# Patient Record
Sex: Male | Born: 1935
Health system: Southern US, Community
[De-identification: ages and names within clinical notes are randomized; demographics above are authoritative.]

## PROBLEM LIST (undated history)

## (undated) DIAGNOSIS — E785 Hyperlipidemia, unspecified: Secondary | ICD-10-CM

## (undated) DIAGNOSIS — C61 Malignant neoplasm of prostate: Secondary | ICD-10-CM

## (undated) DIAGNOSIS — M1990 Unspecified osteoarthritis, unspecified site: Secondary | ICD-10-CM

## (undated) DIAGNOSIS — C6990 Malignant neoplasm of unspecified site of unspecified eye: Secondary | ICD-10-CM

## (undated) DIAGNOSIS — H8110 Benign paroxysmal vertigo, unspecified ear: Secondary | ICD-10-CM

## (undated) DIAGNOSIS — N189 Chronic kidney disease, unspecified: Secondary | ICD-10-CM

## (undated) HISTORY — PX: EYE SURGERY: SHX253

## (undated) HISTORY — DX: Unspecified osteoarthritis, unspecified site: M19.90

## (undated) HISTORY — DX: Hyperlipidemia, unspecified: E78.5

## (undated) HISTORY — DX: Chronic kidney disease, unspecified: N18.9

## (undated) HISTORY — PX: SKIN GRAFT: SHX250

## (undated) HISTORY — DX: Benign paroxysmal vertigo, unspecified ear: H81.10

## (undated) HISTORY — PX: JOINT REPLACEMENT: SHX530

---

## 2005-02-23 ENCOUNTER — Ambulatory Visit: Payer: Self-pay | Admitting: Chiropractic Medicine

## 2005-08-10 ENCOUNTER — Ambulatory Visit: Payer: Self-pay | Admitting: Ophthalmology

## 2005-12-29 ENCOUNTER — Ambulatory Visit: Payer: Self-pay | Admitting: Gastroenterology

## 2006-02-06 ENCOUNTER — Ambulatory Visit: Payer: Self-pay | Admitting: Cardiovascular Disease

## 2006-02-09 ENCOUNTER — Ambulatory Visit: Payer: Self-pay | Admitting: Cardiovascular Disease

## 2008-09-14 ENCOUNTER — Ambulatory Visit: Payer: Self-pay | Admitting: Family Medicine

## 2008-09-21 ENCOUNTER — Ambulatory Visit: Payer: Self-pay | Admitting: Family Medicine

## 2010-01-17 ENCOUNTER — Emergency Department: Payer: Self-pay | Admitting: Emergency Medicine

## 2010-04-27 ENCOUNTER — Observation Stay: Payer: Self-pay | Admitting: Internal Medicine

## 2010-05-05 ENCOUNTER — Ambulatory Visit: Payer: Self-pay | Admitting: Cardiovascular Disease

## 2011-04-07 ENCOUNTER — Emergency Department: Payer: Self-pay | Admitting: Emergency Medicine

## 2012-04-10 ENCOUNTER — Ambulatory Visit: Payer: Self-pay | Admitting: Family Medicine

## 2012-09-17 ENCOUNTER — Ambulatory Visit: Payer: Self-pay | Admitting: General Practice

## 2012-09-17 LAB — URINALYSIS, COMPLETE
Blood: NEGATIVE
Glucose,UR: NEGATIVE mg/dL (ref 0–75)
Protein: 30
RBC,UR: 3 /HPF (ref 0–5)
Specific Gravity: 1.026 (ref 1.003–1.030)
Squamous Epithelial: <1

## 2012-09-17 LAB — BASIC METABOLIC PANEL
Calcium, Total: 8.8 mg/dL (ref 8.5–10.1)
Creatinine: 1.28 mg/dL (ref 0.60–1.30)
EGFR (African American): >60
EGFR (Non-African Amer.): 55 — ABNORMAL LOW
Glucose: 81 mg/dL (ref 65–99)
Osmolality: 289 (ref 275–301)

## 2012-09-17 LAB — MRSA PCR SCREENING

## 2012-09-17 LAB — CBC
HCT: 41.2 % (ref 40.0–52.0)
HGB: 14.6 g/dL (ref 13.0–18.0)
MCV: 87 fL (ref 80–100)
RDW: 13.9 % (ref 11.5–14.5)
WBC: 8.9 10*3/uL (ref 3.8–10.6)

## 2012-09-17 LAB — APTT: Activated PTT: 28.6 secs (ref 23.6–35.9)

## 2012-09-17 LAB — PROTIME-INR: INR: 0.9

## 2012-10-03 ENCOUNTER — Inpatient Hospital Stay: Payer: Self-pay | Admitting: General Practice

## 2012-10-04 LAB — BASIC METABOLIC PANEL
Anion Gap: 11 (ref 7–16)
Calcium, Total: 8 mg/dL — ABNORMAL LOW (ref 8.5–10.1)
Co2: 23 mmol/L (ref 21–32)
EGFR (African American): 54 — ABNORMAL LOW
Glucose: 96 mg/dL (ref 65–99)
Osmolality: 284 (ref 275–301)
Sodium: 142 mmol/L (ref 136–145)

## 2012-10-04 LAB — PLATELET COUNT: Platelet: 119 10*3/uL — ABNORMAL LOW (ref 150–440)

## 2012-10-04 LAB — HEMOGLOBIN: HGB: 11.5 g/dL — ABNORMAL LOW (ref 13.0–18.0)

## 2012-10-05 LAB — BASIC METABOLIC PANEL
Anion Gap: 9 (ref 7–16)
Anion Gap: 7 (ref 7–16)
BUN: 20 mg/dL — ABNORMAL HIGH (ref 7–18)
Calcium, Total: 8 mg/dL — ABNORMAL LOW (ref 8.5–10.1)
Calcium, Total: 8.8 mg/dL (ref 8.5–10.1)
Chloride: 106 mmol/L (ref 98–107)
Co2: 25 mmol/L (ref 21–32)
Creatinine: 1.19 mg/dL (ref 0.60–1.30)
Creatinine: 1.5 mg/dL — ABNORMAL HIGH (ref 0.60–1.30)
EGFR (African American): >60
EGFR (African American): 52 — ABNORMAL LOW
EGFR (Non-African Amer.): 60 — ABNORMAL LOW
Glucose: 70 mg/dL (ref 65–99)
Potassium: 5.3 mmol/L — ABNORMAL HIGH (ref 3.5–5.1)
Potassium: 4.4 mmol/L (ref 3.5–5.1)
Sodium: 139 mmol/L (ref 136–145)
Sodium: 138 mmol/L (ref 136–145)

## 2012-10-05 LAB — PLATELET COUNT: Platelet: 78 10*3/uL — ABNORMAL LOW (ref 150–440)

## 2012-10-05 LAB — HEMOGLOBIN: HGB: 11.3 g/dL — ABNORMAL LOW (ref 13.0–18.0)

## 2012-10-06 LAB — BASIC METABOLIC PANEL
Anion Gap: 7 (ref 7–16)
Calcium, Total: 8.1 mg/dL — ABNORMAL LOW (ref 8.5–10.1)
Co2: 25 mmol/L (ref 21–32)
EGFR (African American): >60
EGFR (Non-African Amer.): 54 — ABNORMAL LOW
Glucose: 94 mg/dL (ref 65–99)
Osmolality: 281 (ref 275–301)
Sodium: 140 mmol/L (ref 136–145)

## 2012-10-18 ENCOUNTER — Emergency Department: Payer: Self-pay | Admitting: Emergency Medicine

## 2012-10-18 LAB — COMPREHENSIVE METABOLIC PANEL
Albumin: 2.8 g/dL — ABNORMAL LOW (ref 3.4–5.0)
Alkaline Phosphatase: 107 U/L (ref 50–136)
Anion Gap: 7 (ref 7–16)
BUN: 22 mg/dL — ABNORMAL HIGH (ref 7–18)
Calcium, Total: 9.2 mg/dL (ref 8.5–10.1)
Glucose: 112 mg/dL — ABNORMAL HIGH (ref 65–99)
Osmolality: 283 (ref 275–301)
Potassium: 3.7 mmol/L (ref 3.5–5.1)
SGOT(AST): 22 U/L (ref 15–37)
SGPT (ALT): 33 U/L (ref 12–78)
Total Protein: 6.9 g/dL (ref 6.4–8.2)

## 2012-10-18 LAB — CBC
HCT: 33.9 % — ABNORMAL LOW (ref 40.0–52.0)
HGB: 11.4 g/dL — ABNORMAL LOW (ref 13.0–18.0)
MCV: 88 fL (ref 80–100)
Platelet: 377 10*3/uL (ref 150–440)
RBC: 3.84 10*6/uL — ABNORMAL LOW (ref 4.40–5.90)
RDW: 13.2 % (ref 11.5–14.5)
WBC: 11.3 10*3/uL — ABNORMAL HIGH (ref 3.8–10.6)

## 2013-04-30 ENCOUNTER — Ambulatory Visit: Payer: Self-pay | Admitting: Urology

## 2013-04-30 LAB — BASIC METABOLIC PANEL
Anion Gap: 4 — ABNORMAL LOW (ref 7–16)
BUN: 22 mg/dL — ABNORMAL HIGH (ref 7–18)
Co2: 28 mmol/L (ref 21–32)
Glucose: 79 mg/dL (ref 65–99)

## 2013-05-02 ENCOUNTER — Ambulatory Visit: Payer: Self-pay | Admitting: Urology

## 2013-05-09 ENCOUNTER — Ambulatory Visit: Payer: Self-pay | Admitting: Urology

## 2014-06-02 ENCOUNTER — Ambulatory Visit: Payer: Self-pay | Admitting: Urology

## 2015-03-24 NOTE — Discharge Summary (Signed)
PATIENT NAME:  BASILIO, MEADOW MR#:  [id number] DATE OF BIRTH:  [birth year]  DATE OF ADMISSION:  10/03/2012 DATE OF DISCHARGE:  10/06/2012  ADMITTING DIAGNOSIS: Degenerative arthrosis of the left knee.   DISCHARGE DIAGNOSIS: Degenerative arthrosis of the left knee.   HISTORY: The patient is a pleasant 80 year old male who has been followed at [facility] for progression of left knee pain. He had reported a several year history of intermittent left knee pain that had progressively gotten worse. The pain was occasionally more severe to the point that it required him to use crutches as well as narcotic analgesics. The pain was noted to be aggravated with weight-bearing activities. The patient had not seen any significant improvement in his condition despite use of anti-inflammatory medications as well as activity modifications. He had localized most of the pain along the medial aspect of the knee. His knee pain had progressed to the point that it was significantly interfering with his activities of daily living. X-rays taken in the orthopedic department of [facility] showed narrowing of the medial cartilage space with associated varus alignment. He was noted to have osteophyte as well as subchondral sclerosis. After a discussion of the risks and benefits of surgical intervention, the patient expressed his understanding of the risks and benefits and agreed for plans for surgical intervention.   PROCEDURE: Left total knee arthroplasty using computer-assisted navigation.   ANESTHESIA: Femoral nerve block with spinal.   SOFT TISSUE RELEASE: Anterior cruciate ligament, posterior cruciate ligament, deep medial collateral ligaments, as well as patellofemoral ligament.   IMPLANTS UTILIZED: DePuy PFC Sigma size four posterior stabilized femoral component (cemented), size four MBT tibial component (cemented), 38 mm three pegged oval dome patella (cemented), and a 12.5 mm stabilized rotating platform  polyethylene insert.   HOSPITAL COURSE: The patient tolerated the procedure very well. He had no complications. He was then taken to the PAC-U where he was stabilized and then transferred to the orthopedic floor. The patient began receiving anticoagulation therapy of Lovenox 30 mg subcutaneous every 12 hours per anesthesia and pharmacy protocol. He was fitted with TED stockings bilaterally. These were allowed to be removed one hour per eight hour shift. The left one was applied on day two following removal of the Hemovac and the dressing change. He was also fitted with the AV-I compression foot pumps bilaterally set at 80 mmHg. His calves have been nontender. There has been no evidence of any deep venous thromboses. Heels were elevated off the bed using rolled towels. No evidence of any tissue breakdown noted.   The patient has denied any chest pain or shortness of breath. Vital signs have been stable. He has been afebrile. Hemodynamically, he was stable and required no transfusion other than the Autovac transfusion he was given the first six hours postoperatively. Laboratory studies were for the most part unremarkable. However, on day two he was noted to have a significant increase in his potassium from 4.2 to 5.3 and subsequently a repeat potassium showed that he was normal at 4.4. This was felt to be hemolysis. He has had no symptoms. The remaining laboratory studies were felt to be within tolerable limits.   Physical therapy was initiated on day one for gait training and transfers. Upon being discharged, she was ambulating greater than 200 feet. She was independent with bed to chair transfers. She was able go up and down four sets of steps. Occupational therapy was also initiated on day one for activities of daily living and assistive  devices.   The patient's IV, Foley and Hemovac were discontinued on day two along with a dressing change. The wound was free of any drainage or signs of infection. The  Polar Care was reapplied to the surgical leg maintaining a temperature of 40 to 50 degrees Fahrenheit.   DISPOSITION: The patient is being discharged to home in improved stable condition.   DISCHARGE INSTRUCTIONS:  1. He may weight bear as tolerated.  2. Continue using TED stockings. These are to be worn during the day, but may be removed at night.  3. He is to continue to use a walker until cleared by physical therapy to go to a quad cane.  4. He will receive home health physical therapy for two weeks.  5. Continue using the Polar Care maintaining a temperature of 40 to 50 degrees Fahrenheit. Recommend that he wear this pretty much around-the-clock for the first two weeks.  6. He is placed on a regular diet.  7. He is to resume his regular medications that he was on prior to admission.  8. Prescription for Lovenox 40 mg subcutaneously daily for 14 days was given.  9. He is then to begin taking one 81 mg enteric-coated aspirin per day.  10. A prescription for oxycodone 5 to 10 mg every 4 to 6 hours p.r.n. for pain was given and Ultram 50 to 100 mg q.4-6h. p.r.n. for pain.   PAST MEDICAL HISTORY:  1. Hypertension.  2. Hyperlipidemia.  3. Kidney stones. 4. Prostate cancer.  5. Skin cancer. 6. 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 7. Cataracts.    ____________________________ Vance Peper, PA jrw:ap D: 10/06/2012 08:41:39 ET T: 10/06/2012 14:30:52 ET JOB#: [id number]  cc: Vance Peper, PA, <Dictator> [place] PA ELECTRONICALLY SIGNED 10/09/2012 7:38

## 2015-03-24 NOTE — Op Note (Signed)
PATIENT NAME:  Gerald Barry, Gerald Barry MR#:  [id number] DATE OF BIRTH:  [birth year]  DATE OF PROCEDURE:  10/03/2012  PREOPERATIVE DIAGNOSIS: Degenerative arthrosis of the left knee.   POSTOPERATIVE DIAGNOSIS: Degenerative arthrosis of the left knee.  PROCEDURE PERFORMED: Left total knee arthroplasty using computer-assisted navigation.   SURGEON: Laurice Record. Holley Bouche., MD  ASSISTANT: Vance Peper, PA-C (required to maintain retraction throughout the procedure)   ANESTHESIA: Femoral nerve block and spinal.   ESTIMATED BLOOD LOSS: 100 mL.   FLUIDS REPLACED: 1800 mL of crystalloid.   TOURNIQUET TIME: 106 minutes.   DRAINS: Two medium drains to reinfusion system.   SOFT TISSUE RELEASES: Anterior cruciate ligament, posterior cruciate ligament, deep medial collateral ligament, and patellofemoral ligament.   IMPLANTS UTILIZED: DePuy PFC Sigma size 4 posterior stabilized femoral component (cemented), size 4 MBT tibial component (cemented), 38 mm three peg oval dome patella (cemented), and a 12.5 mm stabilized rotating platform polyethylene insert.   INDICATIONS FOR SURGERY: The patient is a 80 year old male who has been seen for complaints of progressive left knee pain. X-rays demonstrated severe degenerative changes in a tricompartmental fashion. After discussion of the risks and benefits of surgical intervention, the patient expressed his understanding of the risks and benefits and agreed with plans for surgical intervention.   PROCEDURE IN DETAIL: Patient was brought into the Operating Room and, after adequate femoral nerve block and spinal anesthesia was achieved, a tourniquet was placed on patient's upper left thigh. Patient's left knee and leg were cleaned and prepped with alcohol and DuraPrep, draped in the usual sterile fashion. A "timeout" was performed as per usual protocol. The left lower extremity was exsanguinated using Esmarch, and the tourniquet was inflated to 300 mmHg. Anterior longitudinal  incision made followed by a standard mid vastus approach. A large effusion was evacuated. The deep fibers of the medial collateral ligament were elevated in subperiosteal fashion off the medial flare of the tibia so as to maintain a continuous soft tissue sleeve. Patella was subluxed laterally and the patellofemoral ligament was incised. Inspection of the knee demonstrated severe degenerative changes most notably to the medial compartment. Prominent osteophytes were debrided using rongeur. Anterior and posterior cruciate ligaments were excised. Two 4.0 mm Schanz pins were inserted into the femur and into the tibia for attachment of the array of trackers used for computer-assisted navigation. Hip center was identified using circumduction technique. Distal landmarks were mapped using computer. Distal femur and proximal tibia were mapped using computer. Distal femoral cutting guide was positioned using computer-assisted navigation so as to achieve a 5 degrees distal valgus cut. Cut was performed and verified using computer. Distal femur was sized and it was felt that a size 4 femoral component was appropriate. A size 4 cutting guide was positioned and the anterior cut was performed, verified using computer. This was followed by completion of the posterior and chamfer cuts. Femoral cutting guide for central box was positioned and central box cut was performed.   Attention was then directed to the proximal tibia. Medial and lateral menisci were excised. The extramedullary tibial cutting guide was positioned using computer-assisted navigation so as to achieve 0 degree varus valgus alignment and 0 degree posterior slope. Cut was performed and verified using the computer. Proximal tibia was sized was felt that a size 4 tibial tray was appropriate. Tibial and femoral trials were inserted followed by insertion of first a 10 and subsequently a 12.5 mm polyethylene trial. Excellent mediolateral soft tissue balancing was  appreciated both in  full extension and in flexion. Finally, the patella was cut and prepared so as to accommodate a 38 mm three peg oval dome patella. Patella trial was placed and the knee was placed through a range of motion with excellent patellar tracking appreciated.   Femoral trial was removed. Central post hole for the tibial component was reamed followed by insertion of a keel punch. Tibial trial was removed. Cut surfaces of bone were irrigated with copious amounts of normal saline with antibiotic solution using pulsatile lavage and then suctioned dry. Polymethyl methacrylate cement with gentamicin was mixed in the usual fashion using a vacuum mixer. Cement was applied to the cut surface of the proximal tibia as well as along the undersurface of a size 4 MBT tibial component. Tibial component was positioned and impacted into place. Excess cement was removed using freer elevators. Cement was then applied to the cut surfaces of the femur as well as along the posterior flanges of a size 4 posterior stabilized femoral component. Femoral component was positioned and impacted into place. Excess cement was removed using freer elevators. A 12.5 mm polyethylene trial was inserted and the knee was brought into full extension with steady axial compression applied. Finally, cement was applied to the backside of a 30 mm three peg oval dome patella and patellar component was positioned and patellar clamp applied. After adequate curing of cement, tourniquet was deflated after total tourniquet time of 106 minutes. Hemostasis was achieved using electrocautery. The knee was irrigated with copious amounts of normal saline with antibiotic solution using pulsatile lavage and then suctioned dry. The knee was inspected for any residual cement debris. 30 mL of 0.25% Marcaine with epinephrine was injected along the posterior capsule. A 12.5 mm stabilized rotating platform polyethylene insert was inserted and the knee was placed  through a range of motion with excellent patellar tracking appreciated and excellent mediolateral soft tissue balancing noted. Two medium drains were placed in the wound bed and brought out through a separate stab incision. The medial parapatellar portion of the incision was reapproximated using interrupted sutures of #1 Vicryl. Subcutaneous tissue was approximated in layers using first #0 Vicryl followed by 2-0 Vicryl. Skin was closed with skin staples. A sterile dressing was applied.   Patient tolerated procedure well. He was transported to the recovery room in stable condition.   ____________________________ Laurice Record. Holley Bouche., MD jph:cms D: 10/03/2012 20:29:16 ET T: 10/04/2012 09:15:14 ET JOB#: [id number]  cc: Jeneen Rinks P. Holley Bouche., MD, <Dictator> Laurice Record Holley Bouche MD ELECTRONICALLY SIGNED 10/05/2012 7:43

## 2015-05-06 ENCOUNTER — Emergency Department: Payer: Medicare Other

## 2015-05-06 ENCOUNTER — Emergency Department
Admission: EM | Admit: 2015-05-06 | Discharge: 2015-05-06 | Disposition: A | Discharge status: Other Acute Inpatient Hospital | Payer: Medicare Other | Attending: Emergency Medicine | Admitting: Emergency Medicine

## 2015-05-06 ENCOUNTER — Encounter: Payer: Self-pay | Admitting: *Deleted

## 2015-05-06 DIAGNOSIS — S6992XA Unspecified injury of left wrist, hand and finger(s), initial encounter: Secondary | ICD-10-CM | POA: Diagnosis present

## 2015-05-06 DIAGNOSIS — Y9389 Activity, other specified: Secondary | ICD-10-CM | POA: Insufficient documentation

## 2015-05-06 DIAGNOSIS — Y998 Other external cause status: Secondary | ICD-10-CM | POA: Insufficient documentation

## 2015-05-06 DIAGNOSIS — S50812A Abrasion of left forearm, initial encounter: Secondary | ICD-10-CM | POA: Diagnosis not present

## 2015-05-06 DIAGNOSIS — C6990 Malignant neoplasm of unspecified site of unspecified eye: Secondary | ICD-10-CM | POA: Insufficient documentation

## 2015-05-06 DIAGNOSIS — S61402A Unspecified open wound of left hand, initial encounter: Secondary | ICD-10-CM | POA: Diagnosis not present

## 2015-05-06 DIAGNOSIS — Z23 Encounter for immunization: Secondary | ICD-10-CM | POA: Diagnosis not present

## 2015-05-06 DIAGNOSIS — T1490XA Injury, unspecified, initial encounter: Secondary | ICD-10-CM

## 2015-05-06 DIAGNOSIS — Y92410 Unspecified street and highway as the place of occurrence of the external cause: Secondary | ICD-10-CM | POA: Insufficient documentation

## 2015-05-06 DIAGNOSIS — T148XXA Other injury of unspecified body region, initial encounter: Secondary | ICD-10-CM

## 2015-05-06 DIAGNOSIS — S50811A Abrasion of right forearm, initial encounter: Secondary | ICD-10-CM | POA: Diagnosis not present

## 2015-05-06 HISTORY — DX: Malignant neoplasm of prostate: C61

## 2015-05-06 HISTORY — DX: Malignant neoplasm of unspecified site of unspecified eye: C69.90

## 2015-05-06 MED ORDER — TETANUS-DIPHTH-ACELL PERTUSSIS 5-2.5-18.5 LF-MCG/0.5 IM SUSP
INTRAMUSCULAR | Status: AC
  Filled 2015-05-06: qty 0.5

## 2015-05-06 MED ORDER — FENTANYL CITRATE (PF) 100 MCG/2ML IJ SOLN
50.0000 ug | Freq: Once | INTRAMUSCULAR | Status: AC
Start: 2015-05-06 — End: 2015-05-06
  Administered 2015-05-06: 50 ug via INTRAVENOUS

## 2015-05-06 MED ORDER — FENTANYL CITRATE (PF) 100 MCG/2ML IJ SOLN
INTRAMUSCULAR | Status: AC
  Filled 2015-05-06: qty 2

## 2015-05-06 MED ORDER — FENTANYL CITRATE (PF) 100 MCG/2ML IJ SOLN
50.0000 ug | Freq: Once | INTRAMUSCULAR | Status: AC
  Administered 2015-05-06: 50 ug via INTRAVENOUS

## 2015-05-06 MED ORDER — CEFAZOLIN SODIUM-DEXTROSE 2-3 GM-% IV SOLR
2.0000 g | INTRAVENOUS | Status: AC
  Administered 2015-05-06: 2 g via INTRAVENOUS
  Filled 2015-05-06: qty 50

## 2015-05-06 MED ORDER — TETANUS-DIPHTH-ACELL PERTUSSIS 5-2.5-18.5 LF-MCG/0.5 IM SUSP
0.5000 mL | Freq: Once | INTRAMUSCULAR | Status: AC
  Administered 2015-05-06: 0.5 mL via INTRAMUSCULAR

## 2015-05-06 NOTE — ED Notes (Signed)
Report called to Silvio Pate, RN charge nurse.

## 2015-05-06 NOTE — ED Notes (Signed)
Nurse on hold for calling report to [facility] transfer center at this time
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
## 2015-05-06 NOTE — ED Notes (Signed)
Transfer center unable to connect me to [facility] ER charge nurse. Nurse given new number and will attempt to reach charge nurse
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
## 2015-05-06 NOTE — ED Notes (Signed)
[place] at bedside at this time to transfer pt to [facility]. Pt c/o of continued pain to left hand. New verbal orders received for Fentanyl 50 mcg. Given in hall by this RN prior to transfer

## 2015-05-08 DIAGNOSIS — S61409A Unspecified open wound of unspecified hand, initial encounter: Secondary | ICD-10-CM | POA: Insufficient documentation

## 2015-05-08 DIAGNOSIS — S62309B Unspecified fracture of unspecified metacarpal bone, initial encounter for open fracture: Secondary | ICD-10-CM | POA: Insufficient documentation

## 2015-05-08 DIAGNOSIS — R339 Retention of urine, unspecified: Secondary | ICD-10-CM | POA: Insufficient documentation

## 2015-05-08 DIAGNOSIS — S50811A Abrasion of right forearm, initial encounter: Secondary | ICD-10-CM | POA: Insufficient documentation

## 2015-06-10 ENCOUNTER — Telehealth: Payer: Self-pay | Admitting: Family Medicine

## 2015-06-10 NOTE — Telephone Encounter (Signed)
Noted. Sent to MAC's box for FYI.

## 2015-06-10 NOTE — Telephone Encounter (Signed)
Forward to Dr. Wynetta Emery. I do not see anything in patients PP chart.

## 2015-06-10 NOTE — Telephone Encounter (Signed)
Margaretha Sheffield from [facility] called to let us know Pt is refusing home health b/c wife says the doctor told them they didn't need it.

## 2016-03-01 ENCOUNTER — Telehealth: Payer: Self-pay | Admitting: Family Medicine

## 2016-03-01 MED ORDER — HYDROCHLOROTHIAZIDE 12.5 MG PO TABS: 12.5000 mg | ORAL_TABLET | Freq: Every day | ORAL | Status: DC

## 2016-03-01 MED ORDER — BENAZEPRIL HCL 40 MG PO TABS: 40.0000 mg | ORAL_TABLET | Freq: Every day | ORAL | Status: DC

## 2016-03-01 MED ORDER — LOVASTATIN 40 MG PO TABS: 40.0000 mg | ORAL_TABLET | Freq: Every day | ORAL | Status: DC

## 2016-03-01 MED ORDER — PANTOPRAZOLE SODIUM 40 MG PO TBEC: 40.0000 mg | DELAYED_RELEASE_TABLET | Freq: Every day | ORAL | Status: DC

## 2016-03-01 NOTE — Telephone Encounter (Signed)
Ms [place] called and stated that the pt was out of hydrochlorothiazide, benazepril, lovastatin, tantoprazole sent to [facility] court.

## 2016-03-01 NOTE — Telephone Encounter (Signed)
appt scheduled for Monday March 07 2016

## 2016-03-07 ENCOUNTER — Ambulatory Visit (INDEPENDENT_AMBULATORY_CARE_PROVIDER_SITE_OTHER): Payer: Medicare Other | Admitting: Family Medicine

## 2016-03-07 ENCOUNTER — Encounter: Payer: Self-pay | Admitting: Family Medicine

## 2016-03-07 VITALS — BP 162/79 | HR 55 | Temp 97.6°F | Ht 66.6 in | Wt 203.0 lb

## 2016-03-07 DIAGNOSIS — E785 Hyperlipidemia, unspecified: Secondary | ICD-10-CM | POA: Insufficient documentation

## 2016-03-07 DIAGNOSIS — I10 Essential (primary) hypertension: Secondary | ICD-10-CM | POA: Diagnosis not present

## 2016-03-07 LAB — LP+ALT+AST PICCOLO, WAIVED
ALT (SGPT) Piccolo, Waived: 15 U/L (ref 10–47)
AST (SGOT) Piccolo, Waived: 21 U/L (ref 11–38)
Chol/HDL Ratio Piccolo,Waive: 4.7 mg/dL
Cholesterol Piccolo, Waived: 163 mg/dL (ref <200)
HDL CHOL PICCOLO, WAIVED: 35 mg/dL — AB (ref >59)
LDL Chol Calc Piccolo Waived: 103 mg/dL — ABNORMAL HIGH (ref <100)
Triglycerides Piccolo,Waived: 124 mg/dL (ref <150)
VLDL CHOL CALC PICCOLO,WAIVE: 25 mg/dL (ref <30)
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
## 2016-03-08 ENCOUNTER — Encounter: Payer: Self-pay | Admitting: Family Medicine

## 2016-03-08 LAB — BASIC METABOLIC PANEL
BUN / CREAT RATIO: 11 (ref 10–24)
BUN: 17 mg/dL (ref 8–27)
CHLORIDE: 103 mmol/L (ref 96–106)
CO2: 23 mmol/L (ref 18–29)
Calcium: 9.1 mg/dL (ref 8.6–10.2)
Creatinine, Ser: 1.52 mg/dL — ABNORMAL HIGH (ref 0.76–1.27)
GFR calc Af Amer: 50 mL/min/{1.73_m2} — ABNORMAL LOW (ref >59)
GFR calc non Af Amer: 43 mL/min/{1.73_m2} — ABNORMAL LOW (ref >59)
Glucose: 71 mg/dL (ref 65–99)
Potassium: 3.9 mmol/L (ref 3.5–5.2)
SODIUM: 143 mmol/L (ref 134–144)

## 2016-04-25 ENCOUNTER — Encounter: Payer: Self-pay | Admitting: Family Medicine

## 2016-04-25 ENCOUNTER — Ambulatory Visit (INDEPENDENT_AMBULATORY_CARE_PROVIDER_SITE_OTHER): Payer: Medicare Other | Admitting: Family Medicine

## 2016-04-25 VITALS — BP 127/73 | HR 58 | Temp 98.0°F | Ht 66.0 in | Wt 199.0 lb

## 2016-04-25 DIAGNOSIS — Z0000 Encounter for general adult medical examination without abnormal findings: Secondary | ICD-10-CM | POA: Diagnosis not present

## 2016-04-25 DIAGNOSIS — K625 Hemorrhage of anus and rectum: Secondary | ICD-10-CM | POA: Diagnosis not present

## 2016-04-25 DIAGNOSIS — C6992 Malignant neoplasm of unspecified site of left eye: Secondary | ICD-10-CM

## 2016-04-25 DIAGNOSIS — H6122 Impacted cerumen, left ear: Secondary | ICD-10-CM | POA: Diagnosis not present

## 2016-04-25 DIAGNOSIS — E785 Hyperlipidemia, unspecified: Secondary | ICD-10-CM

## 2016-04-25 DIAGNOSIS — C61 Malignant neoplasm of prostate: Secondary | ICD-10-CM

## 2016-04-25 DIAGNOSIS — G4733 Obstructive sleep apnea (adult) (pediatric): Secondary | ICD-10-CM | POA: Diagnosis not present

## 2016-04-25 DIAGNOSIS — I2510 Atherosclerotic heart disease of native coronary artery without angina pectoris: Secondary | ICD-10-CM | POA: Diagnosis not present

## 2016-04-25 DIAGNOSIS — I10 Essential (primary) hypertension: Secondary | ICD-10-CM

## 2016-04-25 DIAGNOSIS — L570 Actinic keratosis: Secondary | ICD-10-CM | POA: Diagnosis not present

## 2016-04-25 LAB — MICROSCOPIC EXAMINATION

## 2016-04-25 LAB — URINALYSIS, ROUTINE W REFLEX MICROSCOPIC
Bilirubin, UA: NEGATIVE
Glucose, UA: NEGATIVE
KETONES UA: NEGATIVE
Leukocytes, UA: NEGATIVE
Nitrite, UA: NEGATIVE
PH UA: 5 (ref 5.0–7.5)
Protein, UA: NEGATIVE
Specific Gravity, UA: 1.02 (ref 1.005–1.030)
Urobilinogen, Ur: 0.2 mg/dL (ref 0.2–1.0)

## 2016-04-25 MED ORDER — HYDROCHLOROTHIAZIDE 12.5 MG PO TABS: 12.5000 mg | ORAL_TABLET | Freq: Every day | ORAL | Status: DC

## 2016-04-25 MED ORDER — PANTOPRAZOLE SODIUM 40 MG PO TBEC: 40.0000 mg | DELAYED_RELEASE_TABLET | Freq: Every day | ORAL | Status: DC

## 2016-04-25 MED ORDER — LOVASTATIN 40 MG PO TABS: 40.0000 mg | ORAL_TABLET | Freq: Every day | ORAL | Status: DC

## 2016-04-25 MED ORDER — BENAZEPRIL HCL 40 MG PO TABS: 40.0000 mg | ORAL_TABLET | Freq: Every day | ORAL | Status: DC

## 2016-04-25 NOTE — Progress Notes (Signed)
BP 127/73 mmHg  Pulse 58  Temp(Src) 98 F (36.7 C)  Ht 5\' 6"  (1.676 m)  Wt 199 lb (90.266 kg)  BMI 32.13 kg/m2  SpO2 96%   Subjective:    Patient ID: Gerald Barry, male    DOB: [birth year], 81 y.o.   MRN: [id number]  HPI: Gerald Barry is a 81 y.o. male  Chief Complaint  Patient presents with  . Annual Exam  Patient follow-up doing well no complaints for blood pressure which is good control with no complaints from medications taken faithfully Same for cholesterol and reflux No issues with eye getting good reports skin is healed with no further cancer reports.  Relevant past medical, surgical, family and social history reviewed and updated as indicated. Interim medical history since our last visit reviewed. Allergies and medications reviewed and updated.  Review of Systems  Constitutional: Negative.   HENT: Negative.   Eyes: Negative.   Respiratory: Negative.   Cardiovascular: Negative.   Gastrointestinal: Negative.   Endocrine: Negative.   Genitourinary: Negative.   Musculoskeletal: Negative.   Skin: Negative.   Allergic/Immunologic: Negative.   Neurological: Negative.   Hematological: Negative.   Psychiatric/Behavioral: Negative.     Per HPI unless specifically indicated above     Objective:    BP 127/73 mmHg  Pulse 58  Temp(Src) 98 F (36.7 C)  Ht 5\' 6"  (1.676 m)  Wt 199 lb (90.266 kg)  BMI 32.13 kg/m2  SpO2 96%  Wt Readings from Last 3 Encounters:  04/25/16 199 lb (90.266 kg)  03/07/16 203 lb (92.08 kg)  04/01/15 215 lb (97.523 kg)    Physical Exam  Constitutional: He is oriented to person, place, and time. He appears well-developed and well-nourished.  HENT:  Head: Normocephalic and atraumatic.  Right Ear: External ear normal.  Left Ear: External ear normal.  Patient with impacted cerumen left ear removed with water and instruments revealing normal canal and TM patient tolerated the procedure well.  Eyes: Conjunctivae and EOM are normal.  Pupils are equal, round, and reactive to light.  Neck: Normal range of motion. Neck supple.  Cardiovascular: Normal rate, regular rhythm, normal heart sounds and intact distal pulses.   Pulmonary/Chest: Effort normal and breath sounds normal.  Abdominal: Soft. Bowel sounds are normal. There is no splenomegaly or hepatomegaly.  Genitourinary: Rectum normal, prostate normal and penis normal.  Musculoskeletal: Normal range of motion.  Neurological: He is alert and oriented to person, place, and time. He has normal reflexes.  Skin: No rash noted. No erythema.  Multiple actinic keratosis on face and shoulders  Psychiatric: He has a normal mood and affect. His behavior is normal. Judgment and thought content normal.    Results for orders placed or performed in visit on 03/07/16  LP+ALT+AST Piccolo, [employer]  Result Value Ref Range   ALT (SGPT) Piccolo, Waived 15 10 - 47 U/L   AST (SGOT) Piccolo, Waived 21 11 - 38 U/L   Cholesterol Piccolo, Waived 163 <200 mg/dL   HDL Chol Piccolo, Waived 35 (L) >59 mg/dL   Triglycerides Piccolo,Waived 124 <150 mg/dL   Chol/HDL Ratio Piccolo,Waive 4.7 mg/dL   LDL Chol Calc Piccolo Waived 103 (H) <100 mg/dL   VLDL Chol Calc Piccolo,Waive 25 <30 mg/dL  Basic metabolic panel  Result Value Ref Range   Glucose 71 65 - 99 mg/dL   BUN 17 8 - 27 mg/dL   Creatinine, Ser 1.52 (H) 0.76 - 1.27 mg/dL   GFR calc non Af Wyvonnia Lora  43 (L) >59 mL/min/1.73   GFR calc Af Amer 50 (L) >59 mL/min/1.73   BUN/Creatinine Ratio 11 10 - 24   Sodium 143 134 - 144 mmol/L   Potassium 3.9 3.5 - 5.2 mmol/L   Chloride 103 96 - 106 mmol/L   CO2 23 18 - 29 mmol/L   Calcium 9.1 8.6 - 10.2 mg/dL      Assessment & Plan:   Problem List Items Addressed This Visit      Cardiovascular and Mediastinum   Essential hypertension    The current medical regimen is effective;  continue present plan and medications.       Relevant Medications   lovastatin (MEVACOR) 40 MG tablet    hydrochlorothiazide (HYDRODIURIL) 12.5 MG tablet   benazepril (LOTENSIN) 40 MG tablet     Genitourinary   Prostate cancer ([place])    Followed by Dr. Eliberto Ivory has regular appointments Will check PSA here today        Other   Eye cancer [facility])   Hyperlipidemia    The current medical regimen is effective;  continue present plan and medications.       Relevant Medications   lovastatin (MEVACOR) 40 MG tablet   hydrochlorothiazide (HYDRODIURIL) 12.5 MG tablet   benazepril (LOTENSIN) 40 MG tablet    Other Visit Diagnoses    Routine general medical examination at a health care facility    -  Primary    Relevant Orders    CBC with Differential/Platelet    Comprehensive metabolic panel    Lipid Panel w/o Chol/HDL Ratio    PSA    TSH    Urinalysis, Routine w reflex microscopic (not at [facility])    Cerumen impaction, left        Actinic keratosis        Relevant Orders    Ambulatory referral to Dermatology        Follow up plan: Return in about 6 months (around 10/26/2016) for BMP, lipids, ALT, AST.

## 2016-04-25 NOTE — Assessment & Plan Note (Signed)
The current medical regimen is effective;  continue present plan and medications.  

## 2016-04-25 NOTE — Assessment & Plan Note (Signed)
Followed by Dr. Eliberto Ivory has regular appointments Will check PSA here today

## 2016-04-26 ENCOUNTER — Encounter: Payer: Self-pay | Admitting: Family Medicine

## 2016-04-26 LAB — COMPREHENSIVE METABOLIC PANEL
ALT: 13 IU/L (ref 0–44)
AST: 15 IU/L (ref 0–40)
Albumin/Globulin Ratio: 1.6 (ref 1.2–2.2)
Albumin: 4.4 g/dL (ref 3.5–4.8)
Alkaline Phosphatase: 104 IU/L (ref 39–117)
BILIRUBIN TOTAL: 0.4 mg/dL (ref 0.0–1.2)
BUN / CREAT RATIO: 14 (ref 10–24)
BUN: 21 mg/dL (ref 8–27)
CHLORIDE: 98 mmol/L (ref 96–106)
CO2: 23 mmol/L (ref 18–29)
Calcium: 9.6 mg/dL (ref 8.6–10.2)
Creatinine, Ser: 1.52 mg/dL — ABNORMAL HIGH (ref 0.76–1.27)
GFR calc Af Amer: 50 mL/min/{1.73_m2} — ABNORMAL LOW (ref >59)
GFR calc non Af Amer: 43 mL/min/{1.73_m2} — ABNORMAL LOW (ref >59)
GLUCOSE: 83 mg/dL (ref 65–99)
Globulin, Total: 2.8 g/dL (ref 1.5–4.5)
Potassium: 4.6 mmol/L (ref 3.5–5.2)
Sodium: 141 mmol/L (ref 134–144)
Total Protein: 7.2 g/dL (ref 6.0–8.5)

## 2016-04-26 LAB — CBC WITH DIFFERENTIAL/PLATELET
BASOS ABS: 0 10*3/uL (ref 0.0–0.2)
BASOS: 0 %
EOS (ABSOLUTE): 0.1 10*3/uL (ref 0.0–0.4)
Eos: 1 %
HEMOGLOBIN: 13.9 g/dL (ref 12.6–17.7)
Hematocrit: 43.5 % (ref 37.5–51.0)
Immature Grans (Abs): 0 10*3/uL (ref 0.0–0.1)
Immature Granulocytes: 0 %
LYMPHS: 48 %
Lymphocytes Absolute: 4.2 10*3/uL — ABNORMAL HIGH (ref 0.7–3.1)
MCH: 26.3 pg — AB (ref 26.6–33.0)
MCHC: 32 g/dL (ref 31.5–35.7)
MCV: 82 fL (ref 79–97)
MONOCYTES: 8 %
Monocytes Absolute: 0.7 10*3/uL (ref 0.1–0.9)
Neutrophils Absolute: 3.9 10*3/uL (ref 1.4–7.0)
Neutrophils: 43 %
Platelets: 183 10*3/uL (ref 150–379)
RBC: 5.29 x10E6/uL (ref 4.14–5.80)
RDW: 15.8 % — ABNORMAL HIGH (ref 12.3–15.4)
WBC: 9 10*3/uL (ref 3.4–10.8)

## 2016-04-26 LAB — LIPID PANEL W/O CHOL/HDL RATIO
Cholesterol, Total: 126 mg/dL (ref 100–199)
HDL: 37 mg/dL — ABNORMAL LOW (ref >39)
LDL Calculated: 71 mg/dL (ref 0–99)
Triglycerides: 89 mg/dL (ref 0–149)
VLDL Cholesterol Cal: 18 mg/dL (ref 5–40)

## 2016-04-26 LAB — TSH: TSH: 2.13 u[IU]/mL (ref 0.450–4.500)

## 2016-04-26 LAB — PSA

## 2016-05-17 ENCOUNTER — Other Ambulatory Visit: Payer: Self-pay | Admitting: Family Medicine

## 2016-05-17 NOTE — Telephone Encounter (Signed)
Confirmed with [place], it is on file

## 2016-05-17 NOTE — Telephone Encounter (Signed)
He gave him a year supply in May- can we make sure they got it?

## 2016-06-03 DIAGNOSIS — H04121 Dry eye syndrome of right lacrimal gland: Secondary | ICD-10-CM | POA: Diagnosis not present

## 2016-06-13 ENCOUNTER — Ambulatory Visit
Admission: EM | Admit: 2016-06-13 | Discharge: 2016-06-13 | Disposition: A | Discharge status: Home / Self Care | Payer: Medicare Other | Attending: Family Medicine | Admitting: Family Medicine

## 2016-06-13 ENCOUNTER — Encounter: Payer: Self-pay | Admitting: *Deleted

## 2016-06-13 DIAGNOSIS — J069 Acute upper respiratory infection, unspecified: Secondary | ICD-10-CM

## 2016-06-13 DIAGNOSIS — L02818 Cutaneous abscess of other sites: Secondary | ICD-10-CM | POA: Diagnosis not present

## 2016-06-13 MED ORDER — LIDOCAINE HCL (PF) 1 % IJ SOLN
5.0000 mL | Freq: Once | INTRAMUSCULAR | Status: AC
  Administered 2016-06-13: 5 mL

## 2016-06-13 MED ORDER — DOXYCYCLINE HYCLATE 100 MG PO CAPS: 100.0000 mg | ORAL_CAPSULE | Freq: Two times a day (BID) | ORAL | Status: DC

## 2016-06-13 NOTE — ED Provider Notes (Signed)
[facility]  ____________________________________________  Time seen: Approximately 1:43 PM  I have reviewed the triage vital signs and the nursing notes.   HISTORY  Chief Complaint Abscess  HPI Gerald Barry is a 81 y.o. male presents with a complaint of abscess to right upper back. Patient reports he noticed this area approximately 1 week ago. Denies history of the same. Patient states that the area is mildly tender. Denies any drainage. States unknown trigger.  Patient also reports over the last 4 days he has had some runny nose, nasal congestion and cough. States cough is occasionally productive of whitish phlegm. States cough is worse at night with postnasal drainage. Denies any chest pain, shortness of breath, abdominal pain, neck pain, back pain, extremity pain or swelling. Denies dizziness or weakness. Denies fevers.   Golden Pop, MD PCP   Past Medical History  Diagnosis Date  . Eye cancer ([place])   . Prostate cancer ([place])   . Eye cancer ([place])   . Vertigo, benign positional   . Arthritis   . Chronic kidney disease   . Hyperlipidemia     Patient Active Problem List   Diagnosis Date Noted  . Prostate cancer ([place]) 04/25/2016  . Essential hypertension 03/07/2016  . Hyperlipidemia 03/07/2016  . Abrasion of right forearm 05/08/2015  . Degloving injury of hand 05/08/2015  . Bladder retention 05/08/2015  . Open fracture of head of metacarpal bone 05/08/2015  . Eye cancer [facility])     Past Surgical History  Procedure Laterality Date  . Joint replacement Left     knee  . Eye surgery      cataract  . Skin graft Left     hand    Current Outpatient Rx  Name  Route  Sig  Dispense  Refill  . aspirin EC 81 MG tablet   Oral   Take 81 mg by mouth.         . benazepril (LOTENSIN) 40 MG tablet   Oral   Take 1 tablet (40 mg total) by mouth daily.   30 tablet   12   . Docusate Calcium (STOOL SOFTENER PO)   Oral   Take by mouth as needed.         .  hydrochlorothiazide (HYDRODIURIL) 12.5 MG tablet   Oral   Take 1 tablet (12.5 mg total) by mouth daily.   30 tablet   12   . lovastatin (MEVACOR) 40 MG tablet   Oral   Take 1 tablet (40 mg total) by mouth daily.   30 tablet   12   . pantoprazole (PROTONIX) 40 MG tablet   Oral   Take 1 tablet (40 mg total) by mouth daily.   30 tablet   12     Allergies Review of patient's allergies indicates no known allergies.  Family History  Problem Relation Age of Onset  . Cancer Mother     Social History Social History  Substance Use Topics  . Smoking status: Never Smoker   . Smokeless tobacco: Current User    Types: Chew  . Alcohol Use: No    Review of Systems Constitutional: No fever/chills Eyes: No visual changes. ENT: No sore throat. As above.  Cardiovascular: Denies chest pain. Respiratory: Denies shortness of breath. Gastrointestinal: No abdominal pain.  No nausea, no vomiting.  No diarrhea.  No constipation. Genitourinary: Negative for dysuria. Musculoskeletal: Negative for back pain. Skin: Negative for rash.As above.  Neurological: Negative for headaches, focal weakness or numbness.  10-point ROS otherwise negative.  ____________________________________________   PHYSICAL EXAM:  VITAL SIGNS: ED Triage Vitals  Enc Vitals Group     BP 06/13/16 1225 98/56 mmHg     Pulse Rate 06/13/16 1225 69     Resp 06/13/16 1225 16     Temp 06/13/16 1225 97.6 F (36.4 C)     Temp Source 06/13/16 1225 Oral     SpO2 06/13/16 1225 98 %     Weight 06/13/16 1225 198 lb (89.812 kg)     Height 06/13/16 1225 5\' 8"  (1.727 m)     Head Cir --      Peak Flow --      Pain Score 06/13/16 1231 0     Pain Loc --      Pain Edu? --      Excl. in [place]? --    Today's Vitals   06/13/16 1225 06/13/16 1231 06/13/16 1241 06/13/16 1415  BP: 98/56  122/78   Pulse: 69  69   Temp: 97.6 F (36.4 C)     TempSrc: Oral     Resp: 16     Height: 5\' 8"  (1.727 m)     Weight: 198 lb (89.812 kg)      SpO2: 98%     PainSc:  0-No pain  1      Constitutional: Alert and oriented. Well appearing and in no acute distress. Eyes: No right conjunctival injection, no right exudate or drainage.  Head: Atraumatic.No sinus tenderness to palpation. No erythema. No swelling.   Ears: no erythema, normal TMs bilaterally.   Nose: nasal congestion and clear rhinorrhea.   Mouth/Throat: Mucous membranes are moist.  Oropharynx non-erythematous. No tonsillar swelling or exudate.  Neck: No stridor.  No cervical spine tenderness to palpation. Hematological/Lymphatic/Immunilogical: No cervical lymphadenopathy. Cardiovascular: Normal rate, regular rhythm. Grossly normal heart sounds.  Good peripheral circulation. Respiratory: Normal respiratory effort.  No retractions. Lungs CTAB.No wheezes, rales or rhonchi. Dry intermittent cough noted in room. Speaks in complete sentences. Gastrointestinal: Soft and nontender. No distention.  No CVA tenderness. Musculoskeletal: No lower or upper extremity tenderness nor edema. Bilateral pedal pulses equal and easily palpated.  Neurologic:  Normal speech and language. No gross focal neurologic deficits are appreciated. No gait instability. Skin:  Skin is warm, dry and intact. No rash noted.  Except: right upper posterior back large fluctuant abscess with pointing present, no drainage, mild immediately surrounding erythema, no further surrounding erythema, mild to mod tenderness at abscess site, no surrounding tenderness, no midline cervical thoracic or lumbar tenderness to palpation.  Psychiatric: Mood and affect are normal. Speech and behavior are normal.  ____________________________________________   LABS (all labs ordered are listed, but only abnormal results are displayed)   Collection Time: 04/25/16  2:08 PM  Result Value Ref Range   Glucose 83 65 - 99 mg/dL   BUN 21 8 - 27 mg/dL   Creatinine, Ser 1.52 (H) 0.76 - 1.27 mg/dL   GFR calc non Af Amer 43 (L) >59  mL/min/1.73   GFR calc Af Amer 50 (L) >59 mL/min/1.73   BUN/Creatinine Ratio 14 10 - 24  Labs reviewed via epic  PROCEDURES  Procedure(s) performed: Procedure(s) performed:  Procedure(s) performed:  Procedure explained and verbal consent obtained. Consent: Verbal consent obtained. Written consent not obtained. Risks and benefits: risks, benefits and alternatives were discussed Patient identity confirmed: verbally with patient and hospital-assigned identification number  Consent given by: patient   I&D abscess Location: right posterior upper back  Preparation: Patient was prepped and draped in the usual sterile fashion. Anesthesia with 1% Lidocaine 5 mls Irrigation solution: saline and betadine Amount of cleaning: copious Incision made with #11 blade scalpel Large amount thick purulent drainage immediately obtained with expression. Sterile forceps used to probe and break up loculations.  1/4 " iodoform gauze used and packed.  Wound culture obtained. Patient tolerate well. Wound well approximated post repair.  dressing applied.  Wound care instructions provided.  Observe for any signs of infection or other problems.   ___________________________   INITIAL IMPRESSION / ASSESSMENT AND PLAN / ED COURSE  Pertinent labs & imaging results that were available during my care of the patient were reviewed by me and considered in my medical decision making (see chart for details).  Well appearing patient. No acute distress. Presents for complaint of gradual onset of abscess as well as few days of runny nose, congestion and cough. Abscess I&D, patient tolerated well. Follow up with PCP or surgery. CMA called and obtained appointment with surgery Dr Azalee Course for tomorrow am at 845 for abscess follow up. Information given to patient by [facility] CMA. Patient does has history of renal insufficiency, recent labs reviewed via epic. Lungs clear throughout. Suspect upper respiratory infection. Will place  patient on oral doxycycline to cover skin and respiratory. Encourage PCP follow up this week. Discussed indication, risks and benefits of medications with patient.Discussed photosensitivity with doxycycline.   Discussed follow up with Primary care physician this week. Discussed follow up and return parameters including no resolution or any worsening concerns. Patient verbalized understanding and agreed to plan.   ____________________________________________   FINAL CLINICAL IMPRESSION(S) / ED DIAGNOSES  Final diagnoses:  Cutaneous abscess of other site  Upper respiratory infection     Discharge Medication List as of 06/13/2016  2:11 PM    START taking these medications   Details  doxycycline (VIBRAMYCIN) 100 MG capsule Take 1 capsule (100 mg total) by mouth 2 (two) times daily., Starting 06/13/2016, Until Discontinued, Normal        Note: This dictation was prepared with Dragon dictation along with smaller phrase technology. Any transcriptional errors that result from this process are unintentional.       Marylene Land, NP 06/13/16 [place], NP 06/13/16 1646

## 2016-06-13 NOTE — ED Notes (Signed)
Patient reports a abscess on his back just below his neck became inflamed 1 week ago. Patient does have a history of abscess. Patient reports additional symptoms of productive cough and weakness for 3 days.

## 2016-06-13 NOTE — Discharge Instructions (Signed)
Take medication as prescribed. Rest. Drink plenty of fluids. Avoid frequent sun exposure as discussed with antibiotics.   Follow up with outpatient surgery as discussed and scheduled.   Follow up with your primary care physician this week. Return to Urgent care for new or worsening concerns.    Abscess An abscess is an infected area that contains a collection of pus and debris.It can occur in almost any part of the body. An abscess is also known as a furuncle or boil. CAUSES  An abscess occurs when tissue gets infected. This can occur from blockage of oil or sweat glands, infection of hair follicles, or a minor injury to the skin. As the body tries to fight the infection, pus collects in the area and creates pressure under the skin. This pressure causes pain. People with weakened immune systems have difficulty fighting infections and get certain abscesses more often.  SYMPTOMS Usually an abscess develops on the skin and becomes a painful mass that is red, warm, and tender. If the abscess forms under the skin, you may feel a moveable soft area under the skin. Some abscesses break open (rupture) on their own, but most will continue to get worse without care. The infection can spread deeper into the body and eventually into the bloodstream, causing you to feel ill.  DIAGNOSIS  Your caregiver will take your medical history and perform a physical exam. A sample of fluid may also be taken from the abscess to determine what is causing your infection. TREATMENT  Your caregiver may prescribe antibiotic medicines to fight the infection. However, taking antibiotics alone usually does not cure an abscess. Your caregiver may need to make a small cut (incision) in the abscess to drain the pus. In some cases, gauze is packed into the abscess to reduce pain and to continue draining the area. HOME CARE INSTRUCTIONS   Only take over-the-counter or prescription medicines for pain, discomfort, or fever as directed  by your caregiver.  If you were prescribed antibiotics, take them as directed. Finish them even if you start to feel better.  If gauze is used, follow your caregiver's directions for changing the gauze.  To avoid spreading the infection:  Keep your draining abscess covered with a bandage.  Wash your hands well.  Do not share personal care items, towels, or whirlpools with others.  Avoid skin contact with others.  Keep your skin and clothes clean around the abscess.  Keep all follow-up appointments as directed by your caregiver. SEEK MEDICAL CARE IF:   You have increased pain, swelling, redness, fluid drainage, or bleeding.  You have muscle aches, chills, or a general ill feeling.  You have a fever. MAKE SURE YOU:   Understand these instructions.  Will watch your condition.  Will get help right away if you are not doing well or get worse.   This information is not intended to replace advice given to you by your health care provider. Make sure you discuss any questions you have with your health care provider.   Document Released: 08/31/2005 Document Revised: 05/22/2012 Document Reviewed: 02/03/2012 Elsevier Interactive Patient Education 2016 Elsevier Inc[employer]  Upper Respiratory Infection, Adult Most upper respiratory infections (URIs) are a viral infection of the air passages leading to the lungs. A URI affects the nose, throat, and upper air passages. The most common type of URI is nasopharyngitis and is typically referred to as "the common cold." URIs run their course and usually go away on their own. Most of the time,  a URI does not require medical attention, but sometimes a bacterial infection in the upper airways can follow a viral infection. This is called a secondary infection. Sinus and middle ear infections are common types of secondary upper respiratory infections. Bacterial pneumonia can also complicate a URI. A URI can worsen asthma and chronic obstructive pulmonary  disease (COPD). Sometimes, these complications can require emergency medical care and may be life threatening.  CAUSES Almost all URIs are caused by viruses. A virus is a type of germ and can spread from one person to another.  RISKS FACTORS You may be at risk for a URI if:   You smoke.   You have chronic heart or lung disease.  You have a weakened defense (immune) system.   You are very young or very old.   You have nasal allergies or asthma.  You work in crowded or poorly ventilated areas.  You work in health care facilities or schools. SIGNS AND SYMPTOMS  Symptoms typically develop 2-3 days after you come in contact with a cold virus. Most viral URIs last 7-10 days. However, viral URIs from the influenza virus (flu virus) can last 14-18 days and are typically more severe. Symptoms may include:   Runny or stuffy (congested) nose.   Sneezing.   Cough.   Sore throat.   Headache.   Fatigue.   Fever.   Loss of appetite.   Pain in your forehead, behind your eyes, and over your cheekbones (sinus pain).  Muscle aches.  DIAGNOSIS  Your health care provider may diagnose a URI by:  Physical exam.  Tests to check that your symptoms are not due to another condition such as:  Strep throat.  Sinusitis.  Pneumonia.  Asthma. TREATMENT  A URI goes away on its own with time. It cannot be cured with medicines, but medicines may be prescribed or recommended to relieve symptoms. Medicines may help:  Reduce your fever.  Reduce your cough.  Relieve nasal congestion. HOME CARE INSTRUCTIONS   Take medicines only as directed by your health care provider.   Gargle warm saltwater or take cough drops to comfort your throat as directed by your health care provider.  Use a warm mist humidifier or inhale steam from a shower to increase air moisture. This may make it easier to breathe.  Drink enough fluid to keep your urine clear or pale yellow.   Eat soups and  other clear broths and maintain good nutrition.   Rest as needed.   Return to work when your temperature has returned to normal or as your health care provider advises. You may need to stay home longer to avoid infecting others. You can also use a face mask and careful hand washing to prevent spread of the virus.  Increase the usage of your inhaler if you have asthma.   Do not use any tobacco products, including cigarettes, chewing tobacco, or electronic cigarettes. If you need help quitting, ask your health care provider. PREVENTION  The best way to protect yourself from getting a cold is to practice good hygiene.   Avoid oral or hand contact with people with cold symptoms.   Wash your hands often if contact occurs.  There is no clear evidence that vitamin C, vitamin E, echinacea, or exercise reduces the chance of developing a cold. However, it is always recommended to get plenty of rest, exercise, and practice good nutrition.  SEEK MEDICAL CARE IF:   You are getting worse rather than better.  Your symptoms are not controlled by medicine.   You have chills.  You have worsening shortness of breath.  You have brown or red mucus.  You have yellow or brown nasal discharge.  You have pain in your face, especially when you bend forward.  You have a fever.  You have swollen neck glands.  You have pain while swallowing.  You have white areas in the back of your throat. SEEK IMMEDIATE MEDICAL CARE IF:   You have severe or persistent:  Headache.  Ear pain.  Sinus pain.  Chest pain.  You have chronic lung disease and any of the following:  Wheezing.  Prolonged cough.  Coughing up blood.  A change in your usual mucus.  You have a stiff neck.  You have changes in your:  Vision.  Hearing.  Thinking.  Mood. MAKE SURE YOU:   Understand these instructions.  Will watch your condition.  Will get help right away if you are not doing well or get  worse.   This information is not intended to replace advice given to you by your health care provider. Make sure you discuss any questions you have with your health care provider.   Document Released: 05/17/2001 Document Revised: 04/07/2015 Document Reviewed: 02/26/2014 Elsevier Interactive Patient Education [employer].

## 2016-06-14 ENCOUNTER — Encounter (INDEPENDENT_AMBULATORY_CARE_PROVIDER_SITE_OTHER): Payer: Self-pay

## 2016-06-14 ENCOUNTER — Ambulatory Visit (INDEPENDENT_AMBULATORY_CARE_PROVIDER_SITE_OTHER): Payer: Medicare Other | Admitting: Surgery

## 2016-06-14 ENCOUNTER — Encounter: Payer: Self-pay | Admitting: Surgery

## 2016-06-14 VITALS — BP 112/62 | HR 68 | Temp 97.9°F | Ht 66.0 in | Wt 198.0 lb

## 2016-06-14 DIAGNOSIS — L02212 Cutaneous abscess of back [any part, except buttock]: Secondary | ICD-10-CM | POA: Diagnosis not present

## 2016-06-14 NOTE — Patient Instructions (Signed)
Please call our office if you have any questions or concerns.  

## 2016-06-14 NOTE — Progress Notes (Signed)
Subjective:     Patient ID: Gerald Barry, Gerald Barry   DOB: [birth year], 81 y.o.   MRN: [id number]  HPI 81 yr old Gerald Barry With an abscess of his upper back which was incised and drained in the emergency department yesterday. Patient states that this abscess came up approximately 7 days ago and continued to increase in size. Patient states that it caused increasing pain redness and had a warm feeling at the area. It was not draining anything prior to yesterday. Patient states that getting the incision done was very painful because it wasn't numb. Patient states that today he feels much better and he has not changed the dressing since that time. Patient denies any fever chills. Patient has had cough and sputum production and he was placed on some antibiotics for this from the emergency department.  Past Medical History  Diagnosis Date  . Eye cancer ([place])   . Prostate cancer ([place])   . Eye cancer ([place])   . Vertigo, benign positional   . Arthritis   . Chronic kidney disease   . Hyperlipidemia    Past Surgical History  Procedure Laterality Date  . Joint replacement Left     knee  . Eye surgery      cataract  . Skin graft Left     hand   Family History  Problem Relation Age of Onset  . Cancer Mother    Social History   Social History  . Marital Status: Married    Spouse Name: N/A  . Number of Children: N/A  . Years of Education: N/A   Social History Main Topics  . Smoking status: Never Smoker   . Smokeless tobacco: Current User    Types: Chew  . Alcohol Use: No  . Drug Use: No  . Sexual Activity: Not Asked   Other Topics Concern  . None   Social History Narrative    Current outpatient prescriptions:  .  aspirin EC 81 MG tablet, Take 81 mg by mouth., Disp: , Rfl:  .  benazepril (LOTENSIN) 40 MG tablet, Take 1 tablet (40 mg total) by mouth daily., Disp: 30 tablet, Rfl: 12 .  Docusate Calcium (STOOL SOFTENER PO), Take by mouth as needed., Disp: , Rfl:  .  doxycycline (VIBRAMYCIN)  100 MG capsule, Take 1 capsule (100 mg total) by mouth 2 (two) times daily., Disp: 20 capsule, Rfl: 0 .  hydrochlorothiazide (HYDRODIURIL) 12.5 MG tablet, Take 1 tablet (12.5 mg total) by mouth daily., Disp: 30 tablet, Rfl: 12 .  lovastatin (MEVACOR) 40 MG tablet, Take 1 tablet (40 mg total) by mouth daily., Disp: 30 tablet, Rfl: 12 .  pantoprazole (PROTONIX) 40 MG tablet, Take 1 tablet (40 mg total) by mouth daily., Disp: 30 tablet, Rfl: 12 No Known Allergies     Review of Systems  Constitutional: Negative for fever, chills, activity change, appetite change and fatigue.  HENT: Positive for congestion. Negative for sore throat.   Respiratory: Positive for cough and shortness of breath. Negative for choking, wheezing and stridor.   Cardiovascular: Negative for chest pain, palpitations and leg swelling.  Gastrointestinal: Negative for abdominal pain, diarrhea and constipation.  Genitourinary: Negative for dysuria.  Musculoskeletal: Negative for back pain and neck pain.  Skin: Positive for color change and wound.  Neurological: Negative for dizziness and weakness.  Hematological: Negative for adenopathy. Does not bruise/bleed easily.  Psychiatric/Behavioral: The patient is not nervous/anxious.   All other systems reviewed and are negative.      Objective:  Physical Exam  Constitutional: He is oriented to person, place, and time. He appears well-developed and well-nourished. No distress.  HENT:  Head: Normocephalic and atraumatic.  Right Ear: External ear normal.  Left Ear: External ear normal.  Nose: Nose normal.  Mouth/Throat: Oropharynx is clear and moist. No oropharyngeal exudate.  Eyes: Conjunctivae and EOM are normal. Pupils are equal, round, and reactive to light. No scleral icterus.  Neck: Normal range of motion. Neck supple. No tracheal deviation present.  Cardiovascular: Normal rate, regular rhythm and intact distal pulses.  Exam reveals no gallop and no friction rub.   No  murmur heard. Pulmonary/Chest: Effort normal. No respiratory distress. He has rales.  Abdominal: Soft. Bowel sounds are normal. He exhibits no distension.  Musculoskeletal: Normal range of motion. He exhibits no edema or tenderness.  Neurological: He is alert and oriented to person, place, and time. No cranial nerve deficit.  Skin: Skin is warm and dry. There is erythema.  3cm area of erythema surrounding 53mm area of incision, packing removed and no purulence able to be further extruded from the area, however very soiled gauze over the incision with purulent material. Area repacked with 1/2 gauze.   Psychiatric: He has a normal mood and affect. His behavior is normal. Judgment and thought content normal.  Vitals reviewed.      Filed Vitals:   06/14/16 0856  BP: 112/62  Pulse: 68  Temp: 97.9 F (36.6 C)    Assessment:     81 yr old Gerald Barry with abscess of upper back    Plan:     I reviewed the notes from the emergency department noting likely upper respiratory infection and a abscess of the back which was incised and drained. Area appears to be healing nicely and no purulence was able to be drained this a.m. We'll have him continue to pack the area twice daily, he states that his wife will be able to do this at home. Instructed him that if she has any questions or concerns or needs a demonstration of this that he can return for nurse visit. Otherwise we'll have him continue his antibiotic that was prescribed in the emergency department and have him return in one week for wound check.

## 2016-06-21 ENCOUNTER — Encounter: Payer: Self-pay | Admitting: General Surgery

## 2016-06-21 ENCOUNTER — Ambulatory Visit (INDEPENDENT_AMBULATORY_CARE_PROVIDER_SITE_OTHER): Payer: Medicare Other | Admitting: General Surgery

## 2016-06-21 VITALS — BP 112/67 | HR 70 | Temp 97.8°F | Ht 66.0 in | Wt 198.0 lb

## 2016-06-21 DIAGNOSIS — L02212 Cutaneous abscess of back [any part, except buttock]: Secondary | ICD-10-CM | POA: Diagnosis not present

## 2016-06-21 NOTE — Progress Notes (Signed)
Outpatient Surgical Follow Up  06/21/2016  Gerald Barry is an 81 y.o. male.   Chief Complaint  Patient presents with  . Follow-up    Seen in ED 06/13/16- Infected Sebaceous Cyst/ Opened/Drained    HPI: 81 year old male returns to clinic 1 week status post incision, drainage, packing of a subscapular abscess. This is likely an infected sebaceous cyst. Patient reports that he is continued to improve and feels much better. His wife has been doing daily packing changes and reports that the packing being inserted now is about half what was week ago. He has 2 days left of antibiotics. He denies any fevers, chills, nausea, vomiting, chest pain, shortness of breath.  Past Medical History  Diagnosis Date  . Eye cancer ([place])   . Prostate cancer ([place])   . Eye cancer ([place])   . Vertigo, benign positional   . Arthritis   . Chronic kidney disease   . Hyperlipidemia     Past Surgical History  Procedure Laterality Date  . Joint replacement Left     knee  . Eye surgery      cataract  . Skin graft Left     hand    Family History  Problem Relation Age of Onset  . Cancer Mother     Social History:  reports that he has never smoked. His smokeless tobacco use includes Chew. He reports that he does not drink alcohol or use illicit drugs.  Allergies: No Known Allergies  Medications reviewed.    ROS A multipoint review of systems was completed, all pertinent positives and negatives are documented within the history of present illness and remainder are negative.   BP 112/67 mmHg  Pulse 70  Temp(Src) 97.8 F (36.6 C) (Oral)  Ht 5\' 6"  (1.676 m)  Wt 89.812 kg (198 lb)  BMI 31.97 kg/m2  Physical Exam Gen.: No acute distress Chest: Clear to auscultation Heart: Regular rhythm Abdomen: Soft and nontender, easily reducible umbilical hernia that is nontender. Skin: Previously drained abscess clearly visible in the right upper back. No evidence of spreading erythema or purulence. Packing  is in place.    No results found for this or any previous visit (from the past 48 hour(s)). No results found.  Assessment/Plan:  1. Abscess of upper back excluding scapular region 81 year old male with resolving abscess of the right upper back. Doing very well. Discussed that he should complete his oral antibiotics. He is to continue packing until less than 1 inch packing can be inserted. He'll follow up in clinic next week to ensure continued resolution.     Clayburn Pert, MD FACS General Surgeon  06/21/2016,12:06 PM

## 2016-06-21 NOTE — Patient Instructions (Signed)
PLease call our office if you have any questions or concerns.

## 2016-06-27 ENCOUNTER — Encounter: Payer: Self-pay | Admitting: Surgery

## 2016-06-27 ENCOUNTER — Ambulatory Visit (INDEPENDENT_AMBULATORY_CARE_PROVIDER_SITE_OTHER): Payer: Medicare Other | Admitting: Surgery

## 2016-06-27 VITALS — BP 111/63 | HR 69 | Temp 98.3°F | Ht 66.0 in | Wt 201.0 lb

## 2016-06-27 DIAGNOSIS — L02212 Cutaneous abscess of back [any part, except buttock]: Secondary | ICD-10-CM

## 2016-06-27 NOTE — Progress Notes (Signed)
Patient returns today after incision and drainage of a infected sebaceous cyst. He has no complaints and is no longer able to pack it.  Wound is essentially completely healed there is no erythema no expressible purulence and no tenderness or mass.  Recommend follow up on an as-needed basis.

## 2016-06-27 NOTE — Patient Instructions (Signed)
Please give us a call if you have any questions or concerns. 

## 2016-10-07 ENCOUNTER — Encounter: Payer: Self-pay | Admitting: *Deleted

## 2016-10-07 ENCOUNTER — Emergency Department: Payer: Medicare Other

## 2016-10-07 ENCOUNTER — Emergency Department
Admission: EM | Admit: 2016-10-07 | Discharge: 2016-10-07 | Disposition: A | Discharge status: Home / Self Care | Payer: Medicare Other | Attending: Emergency Medicine | Admitting: Emergency Medicine

## 2016-10-07 DIAGNOSIS — F17220 Nicotine dependence, chewing tobacco, uncomplicated: Secondary | ICD-10-CM | POA: Insufficient documentation

## 2016-10-07 DIAGNOSIS — R55 Syncope and collapse: Secondary | ICD-10-CM | POA: Diagnosis not present

## 2016-10-07 DIAGNOSIS — Z8546 Personal history of malignant neoplasm of prostate: Secondary | ICD-10-CM | POA: Insufficient documentation

## 2016-10-07 DIAGNOSIS — Z7982 Long term (current) use of aspirin: Secondary | ICD-10-CM | POA: Insufficient documentation

## 2016-10-07 DIAGNOSIS — Z79899 Other long term (current) drug therapy: Secondary | ICD-10-CM | POA: Diagnosis not present

## 2016-10-07 DIAGNOSIS — N189 Chronic kidney disease, unspecified: Secondary | ICD-10-CM | POA: Insufficient documentation

## 2016-10-07 DIAGNOSIS — N179 Acute kidney failure, unspecified: Secondary | ICD-10-CM

## 2016-10-07 DIAGNOSIS — E860 Dehydration: Secondary | ICD-10-CM | POA: Diagnosis not present

## 2016-10-07 DIAGNOSIS — R42 Dizziness and giddiness: Secondary | ICD-10-CM | POA: Diagnosis not present

## 2016-10-07 DIAGNOSIS — Z85840 Personal history of malignant neoplasm of eye: Secondary | ICD-10-CM | POA: Insufficient documentation

## 2016-10-07 DIAGNOSIS — I129 Hypertensive chronic kidney disease with stage 1 through stage 4 chronic kidney disease, or unspecified chronic kidney disease: Secondary | ICD-10-CM | POA: Diagnosis not present

## 2016-10-07 LAB — CBC WITH DIFFERENTIAL/PLATELET
BASOS PCT: 1 %
Basophils Absolute: 0.1 10*3/uL (ref 0–0.1)
EOS ABS: 0.1 10*3/uL (ref 0–0.7)
Eosinophils Relative: 1 %
HEMATOCRIT: 41.7 % (ref 40.0–52.0)
HEMOGLOBIN: 14.6 g/dL (ref 13.0–18.0)
LYMPHS ABS: 2.6 10*3/uL (ref 1.0–3.6)
Lymphocytes Relative: 22 %
MCH: 29.1 pg (ref 26.0–34.0)
MCHC: 35.1 g/dL (ref 32.0–36.0)
MCV: 83 fL (ref 80.0–100.0)
MONO ABS: 0.7 10*3/uL (ref 0.2–1.0)
MONOS PCT: 6 %
Neutro Abs: 8.2 10*3/uL — ABNORMAL HIGH (ref 1.4–6.5)
Neutrophils Relative %: 70 %
Platelets: 166 10*3/uL (ref 150–440)
RBC: 5.03 MIL/uL (ref 4.40–5.90)
RDW: 14.2 % (ref 11.5–14.5)
WBC: 11.7 10*3/uL — ABNORMAL HIGH (ref 3.8–10.6)

## 2016-10-07 LAB — COMPREHENSIVE METABOLIC PANEL
ALBUMIN: 4.4 g/dL (ref 3.5–5.0)
ALK PHOS: 74 U/L (ref 38–126)
ALT: 17 U/L (ref 17–63)
AST: 27 U/L (ref 15–41)
Anion gap: 9 (ref 5–15)
BILIRUBIN TOTAL: 0.8 mg/dL (ref 0.3–1.2)
BUN: 23 mg/dL — AB (ref 6–20)
CALCIUM: 9.6 mg/dL (ref 8.9–10.3)
CO2: 28 mmol/L (ref 22–32)
Chloride: 101 mmol/L (ref 101–111)
Creatinine, Ser: 2.08 mg/dL — ABNORMAL HIGH (ref 0.61–1.24)
GFR calc Af Amer: 33 mL/min — ABNORMAL LOW (ref >60)
GFR calc non Af Amer: 29 mL/min — ABNORMAL LOW (ref >60)
GLUCOSE: 113 mg/dL — AB (ref 65–99)
Potassium: 3.8 mmol/L (ref 3.5–5.1)
SODIUM: 138 mmol/L (ref 135–145)
TOTAL PROTEIN: 7.9 g/dL (ref 6.5–8.1)

## 2016-10-07 LAB — BASIC METABOLIC PANEL
Anion gap: 6 (ref 5–15)
BUN: 23 mg/dL — AB (ref 6–20)
CHLORIDE: 105 mmol/L (ref 101–111)
CO2: 28 mmol/L (ref 22–32)
CREATININE: 1.9 mg/dL — AB (ref 0.61–1.24)
Calcium: 8.8 mg/dL — ABNORMAL LOW (ref 8.9–10.3)
GFR calc Af Amer: 37 mL/min — ABNORMAL LOW (ref >60)
GFR calc non Af Amer: 32 mL/min — ABNORMAL LOW (ref >60)
GLUCOSE: 88 mg/dL (ref 65–99)
POTASSIUM: 4.4 mmol/L (ref 3.5–5.1)
SODIUM: 139 mmol/L (ref 135–145)

## 2016-10-07 LAB — LIPASE, BLOOD: LIPASE: 48 U/L (ref 11–51)

## 2016-10-07 LAB — TROPONIN I: Troponin I: <0.03 ng/mL (ref <0.03)

## 2016-10-07 MED ORDER — SODIUM CHLORIDE 0.9 % IV BOLUS (SEPSIS): 500.0000 mL | Freq: Once | INTRAVENOUS | Status: DC

## 2016-10-07 MED ORDER — SODIUM CHLORIDE 0.9 % IV BOLUS (SEPSIS)
500.0000 mL | Freq: Once | INTRAVENOUS | Status: AC
  Administered 2016-10-07: 500 mL via INTRAVENOUS

## 2016-10-07 NOTE — ED Provider Notes (Signed)
Patient with near syncopal episode today while working outside. Originally found to have acute on chronic renal failure.  Quick resolution of blood pressure from 80s to above [id number] systolic. Plan from Dr. Burlene Arnt is to fluid hydrate, recheck labs and reassess for likely discharge to home.   Physical Exam  BP 134/71   Pulse 75   Temp 97.7 F (36.5 C) (Oral)   Resp 19   Ht 5\' 9"  (1.753 m)   Wt 212 lb (96.2 kg)   SpO2 95%   BMI 31.31 kg/m   ----------------------------------------- 5:57 PM on 10/07/2016 -----------------------------------------   Physical Exam Patient resting comfortably at this time. No combines at this time and saying he would like to go home. No distress. ED Course  Procedures  MDM Patient with reassuring second set of lab work with improving renal function as well as a negative second troponin.    Patient says he would like to go home. I advised him to drink plenty of water at home. He says that he does not drink much water but drinks mostly T. He will incorporate more water into his diet and will be following up with his primary care doctor on the 13th. I told him that he will need to have his blood work rechecked at this time to reassess his kidney function. His understanding of this plan and willing to comply. Unable to obtain a urine sample. The patient says that he urinates about twice a day and has urinated already once today. He denies any pain with urination. Denies any frequency or burning. Unlikely to be UTI.       Orbie Pyo, MD 10/07/16 [phone]

## 2016-10-07 NOTE — ED Triage Notes (Signed)
Pt arrives with complaints of dizziness, states EMS reported he had a low BP, states he does not drink much fluids, pt awake and alert, states he did not take his BP meds this AM, at present pt denies any dizziness

## 2016-10-07 NOTE — ED Provider Notes (Signed)
[facility] Emergency Department Provider Note  ____________________________________________   I have reviewed the triage vital signs and the nursing notes.   HISTORY  Chief Complaint Hypotension    HPI Gerald Barry is a 81 y.o. male who has a history of chronic kidney diseaseand positional vertigo prostate cancer remotely, I cancer, who is monocular, who presents today after feeling lightheaded. 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 He does not know his baseline blood pressure is. This time he has no complaints. He denies true vertigo, he did not pass out, he denies any other acute pathology or symptoms. He did feel little nauseated this morning.    Past Medical History:  Diagnosis Date  . Arthritis   . Chronic kidney disease   . Eye cancer ([place])   . Eye cancer ([place])   . Hyperlipidemia   . Prostate cancer ([place])   . Vertigo, benign positional     Patient Active Problem List   Diagnosis Date Noted  . Abscess of upper back excluding scapular region 06/14/2016  . Prostate cancer ([place]) 04/25/2016  . Essential hypertension 03/07/2016  . Hyperlipidemia 03/07/2016  . Abrasion of right forearm 05/08/2015  . Degloving injury of hand 05/08/2015  . Bladder retention 05/08/2015  . Open fracture of head of metacarpal bone 05/08/2015  . Eye cancer [facility])     Past Surgical History:  Procedure Laterality Date  . EYE SURGERY     cataract  . JOINT REPLACEMENT Left    knee  . SKIN GRAFT Left    hand    Prior to Admission medications   Medication Sig Start Date End Date Taking? Authorizing Provider  aspirin EC 81 MG tablet Take 81 mg by mouth daily.    Yes Historical Provider, MD  benazepril (LOTENSIN) 40 MG tablet Take 1 tablet (40 mg total) by mouth daily. 04/25/16  Yes Guadalupe Maple, MD  hydrochlorothiazide (HYDRODIURIL) 12.5 MG tablet Take 1 tablet (12.5 mg total) by mouth  daily. 04/25/16  Yes Guadalupe Maple, MD  lovastatin (MEVACOR) 40 MG tablet Take 1 tablet (40 mg total) by mouth daily. Patient taking differently: Take 40 mg by mouth at bedtime.  04/25/16  Yes Guadalupe Maple, MD  pantoprazole (PROTONIX) 40 MG tablet Take 1 tablet (40 mg total) by mouth daily. 04/25/16  Yes Guadalupe Maple, MD    Allergies Review of patient's allergies indicates no known allergies.  Family History  Problem Relation Age of Onset  . Cancer Mother     Social History Social History  Substance Use Topics  . Smoking status: Never Smoker  . Smokeless tobacco: Current User    Types: Chew  . Alcohol use No    Review of Systems Constitutional: No fever/chills Eyes: No visual changes. ENT: No sore throat. No stiff neck no neck pain Cardiovascular: Denies chest pain. Respiratory: Denies shortness of breath. Gastrointestinal:   no vomiting.  No diarrhea.  No constipation. Genitourinary: Negative for dysuria. Musculoskeletal: Negative lower extremity swelling Skin: Negative for rash. Neurological: Negative for severe headaches, focal weakness or numbness. 10-point ROS otherwise negative.  ____________________________________________   PHYSICAL EXAM:  VITAL SIGNS: ED Triage Vitals  Enc Vitals Group     BP 10/07/16 1314 (!) 81/45     Pulse Rate 10/07/16 1314 75     Resp 10/07/16 1314 16     Temp 10/07/16 1314 97.7 F (36.5 C)  Temp Source 10/07/16 1314 Oral     SpO2 10/07/16 1314 95 %     Weight 10/07/16 1315 212 lb (96.2 kg)     Height 10/07/16 1315 5\' 9"  (1.753 m)     Head Circumference --      Peak Flow --      Pain Score --      Pain Loc --      Pain Edu? --      Excl. in [place]? --     Constitutional: Alert and oriented. Well appearing and in no acute distress. Eyes: Conjunctivae are normal. Left eye missing right eye normal Head: Atraumatic. Nose: No congestion/rhinnorhea. Mouth/Throat: Mucous membranes are moist.  Oropharynx  non-erythematous. Neck: No stridor.   Nontender with no meningismus Cardiovascular: Normal rate, regular rhythm. Grossly normal heart sounds.  Good peripheral circulation. Respiratory: Normal respiratory effort.  No retractions. Lungs CTAB. Abdominal: Soft and nontender. No distention. No guarding no rebound Back:  There is no focal tenderness or step off.  there is no midline tenderness there are no lesions noted. there is no CVA tenderness Musculoskeletal: No lower extremity tenderness, no upper extremity tenderness. No joint effusions, no DVT signs strong distal pulses no edema Neurologic:  Normal speech and language. No gross focal neurologic deficits are appreciated.  Skin:  Skin is warm, dry and intact. No rash noted. Psychiatric: Mood and affect are normal. Speech and behavior are normal.  ____________________________________________   LABS (all labs ordered are listed, but only abnormal results are displayed)  Labs Reviewed  COMPREHENSIVE METABOLIC PANEL - Abnormal; Notable for the following:       Result Value   Glucose, Bld 113 (*)    BUN 23 (*)    Creatinine, Ser 2.08 (*)    GFR calc non Af Amer 29 (*)    GFR calc Af Amer 33 (*)    All other components within normal limits  CBC WITH DIFFERENTIAL/PLATELET - Abnormal; Notable for the following:    WBC 11.7 (*)    Neutro Abs 8.2 (*)    All other components within normal limits  TROPONIN I  LIPASE, BLOOD  URINALYSIS COMPLETEWITH MICROSCOPIC ([facility] ONLY)   ____________________________________________  EKG  I personally interpreted any EKGs ordered by me or triage Sinus rhythm at 73 bpm no acute ST elevation or depression, PVCs noted. ____________________________________________  [id number]  I reviewed any imaging ordered by me or triage that were performed during my shift and, if possible, patient and/or family made aware of any abnormal  findings. ____________________________________________   PROCEDURES  Procedure(s) performed: None  Procedures  Critical Care performed: None  ____________________________________________   INITIAL IMPRESSION / ASSESSMENT AND PLAN / ED COURSE  Pertinent labs & imaging results that were available during my care of the patient were reviewed by me and considered in my medical decision making (see chart for details).  Patient here because he felt lightheaded, he does appear to be somewhat dehydrated we are giving him IV fluids. We'll check orthostatics and urine, chest x-ray is reassuring thus far no evidence of acute ischemia. Patient may require observational input that he also possibly to go home. Creatinine is up from his baseline but again he does look dehydrated.  Clinical Course   ____________________________________________   FINAL CLINICAL IMPRESSION(S) / ED DIAGNOSES  Final diagnoses:  None      This chart was dictated using voice recognition software.  Despite best efforts to proofread,  errors can occur which can change meaning.  Schuyler Amor, MD 10/07/16 1452

## 2016-10-17 ENCOUNTER — Ambulatory Visit (INDEPENDENT_AMBULATORY_CARE_PROVIDER_SITE_OTHER): Payer: Medicare Other

## 2016-10-17 ENCOUNTER — Other Ambulatory Visit: Payer: Medicare Other

## 2016-10-17 DIAGNOSIS — N179 Acute kidney failure, unspecified: Secondary | ICD-10-CM

## 2016-10-17 DIAGNOSIS — Z23 Encounter for immunization: Secondary | ICD-10-CM | POA: Diagnosis not present

## 2016-10-18 ENCOUNTER — Encounter: Payer: Self-pay | Admitting: Family Medicine

## 2016-10-18 LAB — BASIC METABOLIC PANEL
BUN/Creatinine Ratio: 18 (ref 10–24)
BUN: 28 mg/dL — ABNORMAL HIGH (ref 8–27)
CO2: 26 mmol/L (ref 18–29)
Calcium: 9.3 mg/dL (ref 8.6–10.2)
Chloride: 97 mmol/L (ref 96–106)
Creatinine, Ser: 1.53 mg/dL — ABNORMAL HIGH (ref 0.76–1.27)
GFR, EST AFRICAN AMERICAN: 50 mL/min/{1.73_m2} — AB (ref >59)
GFR, EST NON AFRICAN AMERICAN: 43 mL/min/{1.73_m2} — AB (ref >59)
GLUCOSE: 98 mg/dL (ref 65–99)
Potassium: 4.1 mmol/L (ref 3.5–5.2)
Sodium: 141 mmol/L (ref 134–144)

## 2016-10-31 ENCOUNTER — Encounter: Payer: Self-pay | Admitting: Family Medicine

## 2016-10-31 ENCOUNTER — Ambulatory Visit (INDEPENDENT_AMBULATORY_CARE_PROVIDER_SITE_OTHER): Payer: Medicare Other | Admitting: Family Medicine

## 2016-10-31 ENCOUNTER — Ambulatory Visit: Payer: Medicare Other | Admitting: Family Medicine

## 2016-10-31 DIAGNOSIS — I10 Essential (primary) hypertension: Secondary | ICD-10-CM | POA: Diagnosis not present

## 2016-10-31 DIAGNOSIS — R002 Palpitations: Secondary | ICD-10-CM | POA: Diagnosis not present

## 2016-10-31 LAB — URINALYSIS, ROUTINE W REFLEX MICROSCOPIC
Bilirubin, UA: NEGATIVE
GLUCOSE, UA: NEGATIVE
Ketones, UA: NEGATIVE
Leukocytes, UA: NEGATIVE
Nitrite, UA: NEGATIVE
PROTEIN UA: NEGATIVE
Specific Gravity, UA: 1.02 (ref 1.005–1.030)
UUROB: 0.2 mg/dL (ref 0.2–1.0)
pH, UA: 5.5 (ref 5.0–7.5)

## 2016-10-31 LAB — MICROSCOPIC EXAMINATION: WBC, UA: NONE SEEN /hpf (ref 0–?)

## 2016-10-31 NOTE — Assessment & Plan Note (Signed)
Blood pressure is been stable but low

## 2016-10-31 NOTE — Assessment & Plan Note (Addendum)
Because of potential threat to life or bodily function due to his palpitations, bradycardia, and near syncope will schedule 24 hour EKG Precautions given about further episodes back to the emergency room  Patient may need cardiology referral due to bradycardia and potential for needing pacemaker this was reviewed with patient. Reviewed blood pressure medication and hydration status for orthostatic hypotension.

## 2016-10-31 NOTE — Progress Notes (Signed)
BP (!) 106/57 (BP Location: Left Arm, Patient Position: Sitting, Cuff Size: Normal)   Pulse (!) 55   Temp 97.5 F (36.4 C)   Ht 5\' 6"  (1.676 m)   Wt 195 lb 6.4 oz (88.6 kg)   BMI 31.54 kg/m    Subjective:    Patient ID: Gerald Barry, male    DOB: 04/26/1935, 81 y.o.   MRN: [id number]  HPI: Gerald Barry is a 81 y.o. male  Chief Complaint  Patient presents with  . Hospitalization Follow-up    Patient states "I just don't feel like I used to."  Patient in hospital for orthostatic hypotension , and with IV hydration patient recovered and was back to normal. Reviewed patient's notes from ER and had normal blood work and EKG, was felt to be orthostatic hypotension. No urinalysis was done. Patient had another episode last week with feeling bad and weak side down for about 5 minutes and then recovered. Patient has noticed his urine is occasionally very dark and sometimes very clear. No change in medications. Has not checked his pulse during this time has not noticed any palpitations.  Relevant past medical, surgical, family and social history reviewed and updated as indicated. Interim medical history since our last visit reviewed. Allergies and medications reviewed and updated.  Review of Systems  Constitutional: Positive for fatigue. Negative for chills, diaphoresis, fever and unexpected weight change.  HENT: Negative.   Eyes: Negative.   Respiratory: Negative.   Cardiovascular: Negative.   Gastrointestinal: Negative.   Endocrine: Negative.   Genitourinary: Negative.   Musculoskeletal: Negative.   Neurological: Negative.   Hematological: Negative.   Psychiatric/Behavioral: Negative.    EKG was sinus bradycardia no noted PVCs or pauses other than on physical exam  Per HPI unless specifically indicated above     Objective:    BP (!) 106/57 (BP Location: Left Arm, Patient Position: Sitting, Cuff Size: Normal)   Pulse (!) 55   Temp 97.5 F (36.4 C)   Ht 5\' 6"  (1.676  m)   Wt 195 lb 6.4 oz (88.6 kg)   BMI 31.54 kg/m   Wt Readings from Last 3 Encounters:  10/31/16 195 lb 6.4 oz (88.6 kg)  10/07/16 212 lb (96.2 kg)  06/27/16 201 lb (91.2 kg)    Physical Exam  Constitutional: He is oriented to person, place, and time. He appears well-developed and well-nourished. No distress.  HENT:  Head: Normocephalic and atraumatic.  Right Ear: Hearing normal.  Left Ear: Hearing normal.  Nose: Nose normal.  Mouth/Throat: Oropharynx is clear and moist. No oropharyngeal exudate.  Eyes: Conjunctivae and lids are normal. Right eye exhibits no discharge. Left eye exhibits no discharge. No scleral icterus.  Neck: Normal range of motion. No tracheal deviation present. No thyromegaly present.  Cardiovascular: Normal heart sounds.   Occasional pauses noted possibly PVCs.  Pulmonary/Chest: Effort normal. No respiratory distress. He has no wheezes. He has no rales.  Abdominal: Soft. Bowel sounds are normal.  Musculoskeletal: Normal range of motion. He exhibits deformity. He exhibits no edema or tenderness.  Lymphadenopathy:    He has no cervical adenopathy.  Neurological: He is alert and oriented to person, place, and time.  Skin: Skin is warm, dry and intact. No rash noted.  Psychiatric: He has a normal mood and affect. His speech is normal and behavior is normal. Judgment and thought content normal. Cognition and memory are normal.    Results for orders placed or performed in visit on 10/17/16  Basic metabolic panel  Result Value Ref Range   Glucose 98 65 - 99 mg/dL   BUN 28 (H) 8 - 27 mg/dL   Creatinine, Ser 1.53 (H) 0.76 - 1.27 mg/dL   GFR calc non Af Amer 43 (L) >59 mL/min/1.73   GFR calc Af Amer 50 (L) >59 mL/min/1.73   BUN/Creatinine Ratio 18 10 - 24   Sodium 141 134 - 144 mmol/L   Potassium 4.1 3.5 - 5.2 mmol/L   Chloride 97 96 - 106 mmol/L   CO2 26 18 - 29 mmol/L   Calcium 9.3 8.6 - 10.2 mg/dL      Assessment & Plan:   Problem List Items Addressed  This Visit      Cardiovascular and Mediastinum   Essential hypertension    Blood pressure is been stable but low      Relevant Orders   Urinalysis, Routine w reflex microscopic (not at [facility])   Basic metabolic panel   CBC with Differential/Platelet     Other   Palpitations    Because of potential threat to life or bodily function due to his palpitations, bradycardia, and near syncope will schedule 24 hour EKG Precautions given about further episodes back to the emergency room  Patient may need cardiology referral due to bradycardia and potential for needing pacemaker this was reviewed with patient. Reviewed blood pressure medication and hydration status for orthostatic hypotension.       Relevant Orders   EKG 12-Lead (Completed)   Urinalysis, Routine w reflex microscopic (not at [facility])   Basic metabolic panel   CBC with Differential/Platelet       Follow up plan: Return in about 4 weeks (around 11/28/2016).

## 2016-11-01 ENCOUNTER — Encounter: Payer: Self-pay | Admitting: Family Medicine

## 2016-11-01 LAB — CBC WITH DIFFERENTIAL/PLATELET
BASOS ABS: 0 10*3/uL (ref 0.0–0.2)
BASOS: 1 %
EOS (ABSOLUTE): 0.1 10*3/uL (ref 0.0–0.4)
Eos: 1 %
Hematocrit: 41.4 % (ref 37.5–51.0)
Hemoglobin: 13.8 g/dL (ref 12.6–17.7)
IMMATURE GRANS (ABS): 0 10*3/uL (ref 0.0–0.1)
Immature Granulocytes: 0 %
LYMPHS ABS: 3.1 10*3/uL (ref 0.7–3.1)
LYMPHS: 40 %
MCH: 28.3 pg (ref 26.6–33.0)
MCHC: 33.3 g/dL (ref 31.5–35.7)
MCV: 85 fL (ref 79–97)
MONOS ABS: 0.5 10*3/uL (ref 0.1–0.9)
Monocytes: 6 %
NEUTROS ABS: 4.1 10*3/uL (ref 1.4–7.0)
Neutrophils: 52 %
PLATELETS: 203 10*3/uL (ref 150–379)
RBC: 4.87 x10E6/uL (ref 4.14–5.80)
RDW: 14.4 % (ref 12.3–15.4)
WBC: 7.8 10*3/uL (ref 3.4–10.8)

## 2016-11-01 LAB — BASIC METABOLIC PANEL
BUN / CREAT RATIO: 17 (ref 10–24)
BUN: 25 mg/dL (ref 8–27)
CHLORIDE: 98 mmol/L (ref 96–106)
CO2: 27 mmol/L (ref 18–29)
Calcium: 10.2 mg/dL (ref 8.6–10.2)
Creatinine, Ser: 1.43 mg/dL — ABNORMAL HIGH (ref 0.76–1.27)
GFR, EST AFRICAN AMERICAN: 53 mL/min/{1.73_m2} — AB (ref >59)
GFR, EST NON AFRICAN AMERICAN: 46 mL/min/{1.73_m2} — AB (ref >59)
Glucose: 100 mg/dL — ABNORMAL HIGH (ref 65–99)
POTASSIUM: 4.4 mmol/L (ref 3.5–5.2)
SODIUM: 141 mmol/L (ref 134–144)

## 2016-11-02 ENCOUNTER — Inpatient Hospital Stay: Payer: Medicare Other | Admitting: Family Medicine

## 2016-11-07 DIAGNOSIS — M79642 Pain in left hand: Secondary | ICD-10-CM | POA: Diagnosis not present

## 2016-11-10 DIAGNOSIS — K219 Gastro-esophageal reflux disease without esophagitis: Secondary | ICD-10-CM | POA: Diagnosis not present

## 2016-11-10 DIAGNOSIS — R600 Localized edema: Secondary | ICD-10-CM | POA: Diagnosis not present

## 2016-11-10 DIAGNOSIS — M79642 Pain in left hand: Secondary | ICD-10-CM | POA: Diagnosis not present

## 2016-11-10 DIAGNOSIS — E785 Hyperlipidemia, unspecified: Secondary | ICD-10-CM | POA: Diagnosis not present

## 2016-11-10 DIAGNOSIS — Z945 Skin transplant status: Secondary | ICD-10-CM | POA: Diagnosis not present

## 2016-11-10 DIAGNOSIS — M20092 Other deformity of left finger(s): Secondary | ICD-10-CM | POA: Diagnosis not present

## 2016-11-10 DIAGNOSIS — I10 Essential (primary) hypertension: Secondary | ICD-10-CM | POA: Diagnosis not present

## 2016-11-10 DIAGNOSIS — M25642 Stiffness of left hand, not elsewhere classified: Secondary | ICD-10-CM | POA: Diagnosis not present

## 2016-11-10 DIAGNOSIS — Z7409 Other reduced mobility: Secondary | ICD-10-CM | POA: Diagnosis not present

## 2016-11-10 DIAGNOSIS — R278 Other lack of coordination: Secondary | ICD-10-CM | POA: Diagnosis not present

## 2016-11-16 DIAGNOSIS — R002 Palpitations: Secondary | ICD-10-CM | POA: Diagnosis not present

## 2016-11-22 ENCOUNTER — Telehealth: Payer: Self-pay

## 2016-11-22 DIAGNOSIS — R001 Bradycardia, unspecified: Secondary | ICD-10-CM | POA: Diagnosis not present

## 2016-11-22 NOTE — Telephone Encounter (Signed)
Phone call Discussed with patient feeling well not having any more spells which precipitated patient's Holter monitor. On review patient's Holter monitor showing bradycardia spells with rate down to 40. Will refer to cardiology to further evaluate patient's bradycardia.

## 2016-11-22 NOTE — Telephone Encounter (Signed)
Phone call

## 2016-12-07 ENCOUNTER — Ambulatory Visit (INDEPENDENT_AMBULATORY_CARE_PROVIDER_SITE_OTHER): Payer: Medicare Other | Admitting: Cardiology

## 2016-12-07 ENCOUNTER — Encounter (INDEPENDENT_AMBULATORY_CARE_PROVIDER_SITE_OTHER): Payer: Self-pay

## 2016-12-07 ENCOUNTER — Encounter: Payer: Self-pay | Admitting: Cardiology

## 2016-12-07 VITALS — BP 120/60 | HR 66 | Ht 66.0 in | Wt 199.5 lb

## 2016-12-07 DIAGNOSIS — R42 Dizziness and giddiness: Secondary | ICD-10-CM | POA: Diagnosis not present

## 2016-12-07 DIAGNOSIS — I10 Essential (primary) hypertension: Secondary | ICD-10-CM

## 2016-12-07 DIAGNOSIS — R9431 Abnormal electrocardiogram [ECG] [EKG]: Secondary | ICD-10-CM | POA: Diagnosis not present

## 2016-12-07 NOTE — Progress Notes (Signed)
Cardiology Office Note   Date:  12/07/2016   ID:  Gerald Barry, DOB 1935/01/11, MRN [id number]  Referring Doctor:  Golden Pop, MD   Cardiologist:   Wende Bushy, MD   Reason for consultation:  Chief Complaint  Patient presents with  . other    Ref by Dr. Golden Pop for bradycardia. Meds reviewed by the pt. verbally. "doing well."       History of Present Illness: Gerald Barry is a 82 y.o. male who presents for Evaluation for possible bradycardia  Patient reports an episode back in November 2017. He was working out in the yard, cutting limb, when he had an episode of significant dizziness or lightheadedness. He did not pass out. He did throw up a little bit. He denies having chest pain or shortness breath at that time. Family was concerned and EMS was called. He was brought to the ER for further evaluation. He was found to be hypotensive with systolic blood pressure in the 80s. Also found to be dehydrated with elevated creatinine. He was managed in the ER with IV fluids and was discharged home.  He followed up with his PCP and was concerned about bradycardia. Holter monitor was ordered. He was then sent to cardiology for further evaluation.  Patient denies chest pain. He does report some shortness breath but he attributes that to his age. He is not very physically active mainly due to arthritic body pain. No palpitations. No true syncope.   ROS:  Please see the history of present illness. Aside from mentioned under HPI, all other systems are reviewed and negative.     Past Medical History:  Diagnosis Date  . Arthritis   . Chronic kidney disease   . Eye cancer ([place])   . Eye cancer ([place])   . Hyperlipidemia   . Prostate cancer ([place])   . Vertigo, benign positional     Past Surgical History:  Procedure Laterality Date  . EYE SURGERY     cataract  . JOINT REPLACEMENT Left    knee  . SKIN GRAFT Left    hand     reports that he has never smoked. His  smokeless tobacco use includes Chew. He reports that he does not drink alcohol or use drugs.   family history includes Cancer in his mother.   Outpatient Medications Prior to Visit  Medication Sig Dispense Refill  . aspirin EC 81 MG tablet Take 81 mg by mouth daily.     . benazepril (LOTENSIN) 40 MG tablet Take 1 tablet (40 mg total) by mouth daily. 30 tablet 12  . hydrochlorothiazide (HYDRODIURIL) 12.5 MG tablet Take 1 tablet (12.5 mg total) by mouth daily. 30 tablet 12  . lovastatin (MEVACOR) 40 MG tablet Take 1 tablet (40 mg total) by mouth daily. (Patient taking differently: Take 40 mg by mouth at bedtime. ) 30 tablet 12  . pantoprazole (PROTONIX) 40 MG tablet Take 1 tablet (40 mg total) by mouth daily. 30 tablet 12   No facility-administered medications prior to visit.      Allergies: Patient has no known allergies.    PHYSICAL EXAM: VS:  BP 120/60 (BP Location: Right Arm, Patient Position: Sitting, Cuff Size: Normal)   Pulse 66   Ht 5\' 6"  (1.676 m)   Wt 199 lb 8 oz (90.5 kg)   BMI 32.20 kg/m  , Body mass index is 32.2 kg/m. Wt Readings from Last 3 Encounters:  12/07/16 199 lb 8 oz (90.5 kg)  10/31/16 195 lb 6.4 oz (88.6 kg)  10/07/16 212 lb (96.2 kg)    GENERAL:  well developed, well nourished, obese, not in acute distress HEENT: normocephalic, pink conjunctivae, anicteric sclerae, no xanthelasma, normal dentition, oropharynx clear NECK:  no neck vein engorgement, JVP normal, no hepatojugular reflux, carotid upstroke brisk and symmetric, no bruit, no thyromegaly, no lymphadenopathy LUNGS:  good respiratory effort, clear to auscultation bilaterally CV:  PMI not displaced, no thrills, no lifts, S1 and S2 within normal limits, no palpable S3 or S4, no murmurs, no rubs, no gallops ABD:  Soft, nontender, nondistended, normoactive bowel sounds, no abdominal aortic bruit, no hepatomegaly, no splenomegaly MS: nontender back, no kyphosis, no scoliosis, no joint deformities EXT:  2+  DP/PT pulses, no edema, no varicosities, no cyanosis, no clubbing SKIN: warm, nondiaphoretic, normal turgor, no ulcers NEUROPSYCH: alert, oriented to person, place, and time, sensory/motor grossly intact, normal mood, appropriate affect  Recent Labs: 04/25/2016: TSH 2.130 10/07/2016: ALT 17; Hemoglobin 14.6 10/31/2016: BUN 25; Creatinine, Ser 1.43; Platelets 203; Potassium 4.4; Sodium 141   Lipid Panel    Component Value Date/Time   CHOL 126 04/25/2016 1408   CHOL 163 03/07/2016 1322   TRIG 89 04/25/2016 1408   TRIG 124 03/07/2016 1322   HDL 37 (L) 04/25/2016 1408   VLDL 25 03/07/2016 1322   LDLCALC 71 04/25/2016 1408     Other studies Reviewed:  EKG:  The ekg from01/02/2017 was personally reviewed by me and it revealed sinus rhythm, occasional PVCs. 67 BPM. LAFB.  Additional studies/ records that were reviewed personally reviewed by me today include:   Holter monitor 11/16/2016, read by Dr. Jeananne Rama Holter report and rhythm strips reviewed by undersigned Overall rhythm was sinus. Heart rate ranged from 40 T2 83. 40 bpm was at 1:42 AM. Average of 54 BPM. Ventricular ectopy revealed mostly single PVCs 3403, for ventricle couplets. No ventricular runs. Supraventricular ectopy consisted of 45 PACs, 6 atrial pairs, 2 atrial runs the longest of which was only 3 beats. No evidence of atrial fibrillation. No pauses greater than 3 seconds.    ASSESSMENT AND PLAN: Question of bradycardia Holter monitor was reviewed in detail. Minimum heart rate occurred early morning hours, patient asleep No persistent bradycardia with heart rate less than 40 or significant pauses of more than 3 seconds. No significant ventricular or supraventricular ectopy  Abnormal EKG Risk factors for CAD include age, gender, hypertension Recommend further evaluation with echocardiogram and pharmacologic nuclear stress test.  Hypertension BP is well controlled. Continue monitoring BP. Continue current  medical therapy and lifestyle changes.  Episode of dizziness Likely related to hypotension and dehydration.   Current medicines are reviewed at length with the patient today.  The patient does not have concerns regarding medicines.  Labs/ tests ordered today include:  Orders Placed This Encounter  Procedures  . EKG 12-Lead    I had a lengthy and detailed discussion with the patient regarding diagnoses, prognosis, diagnostic options, treatment options , and side effects of medications.   I counseled the patient on importance of lifestyle modification including heart healthy diet, regular physical activity    Disposition:   FU with undersigned after tests   Signed, Wende Bushy, MD  12/07/2016 2:20 PM    [place]  This note was generated in part with voice recognition software and I apologize for any typographical errors that were not detected and corrected.

## 2016-12-07 NOTE — Patient Instructions (Signed)
Testing/Procedures: Your physician has requested that you have an echocardiogram. Echocardiography is a painless test that uses sound waves to create images of your heart. It provides your doctor with information about the size and shape of your heart and how well your heart's chambers and valves are working. This procedure takes approximately one hour. There are no restrictions for this procedure.  [place]  Your caregiver has ordered a Stress Test with nuclear imaging. The purpose of this test is to evaluate the blood supply to your heart muscle. This procedure is referred to as a "Non-Invasive Stress Test." This is because other than having an IV started in your vein, nothing is inserted or "invades" your body. Cardiac stress tests are done to find areas of poor blood flow to the heart by determining the extent of coronary artery disease (CAD). Some patients exercise on a treadmill, which naturally increases the blood flow to your heart, while others who are  unable to walk on a treadmill due to physical limitations have a pharmacologic/chemical stress agent called Lexiscan . This medicine will mimic walking on a treadmill by temporarily increasing your coronary blood flow.   Please note: these test may take anywhere between 2-4 hours to complete  PLEASE REPORT TO [facility] AT THE FIRST DESK WILL DIRECT YOU WHERE TO GO  Date of Procedure:_Monday December 12, 2016 at 08:00AM_  Arrival Time for Procedure:_Arrive at 07:45AM to register__  Instructions regarding medication:   __X__ : Hold hydrochlorothiazide the morning of your procedure   PLEASE NOTIFY THE OFFICE AT LEAST 24 HOURS IN ADVANCE IF YOU ARE UNABLE TO KEEP YOUR APPOINTMENT.  [phone] AND  PLEASE NOTIFY NUCLEAR MEDICINE AT [facility] AT LEAST 24 HOURS IN ADVANCE IF YOU ARE UNABLE TO KEEP YOUR APPOINTMENT. [phone]  How to prepare for your Myoview test:  1. Do not eat or drink after  midnight 2. No caffeine for 24 hours prior to test 3. No smoking 24 hours prior to test. 4. Your medication may be taken with water.  If your doctor stopped a medication because of this test, do not take that medication. 5. Ladies, please do not wear dresses.  Skirts or pants are appropriate. Please wear a short sleeve shirt. 6. No perfume, cologne or lotion. 7. Wear comfortable walking shoes. No heels!   Follow-Up: Your physician recommends that you schedule a follow-up appointment after testing with Dr. Yvone Neu.   It was a pleasure seeing you today here in the office. Please do not hesitate to give [place] a call back if you have any further questions. [place], BSN     Echocardiogram An echocardiogram, or echocardiography, uses sound waves (ultrasound) to produce an image of your heart. The echocardiogram is simple, painless, obtained within a short period of time, and offers valuable information to your health care provider. The images from an echocardiogram can provide information such as:  Evidence of coronary artery disease (CAD).  Heart size.  Heart muscle function.  Heart valve function.  Aneurysm detection.  Evidence of a past heart attack.  Fluid buildup around the heart.  Heart muscle thickening.  Assess heart valve function. Tell a health care provider about:  Any allergies you have.  All medicines you are taking, including vitamins, herbs, eye drops, creams, and over-the-counter medicines.  Any problems you or family members have had with anesthetic medicines.  Any blood disorders you have.  Any surgeries you have had.  Any medical  conditions you have.  Whether you are pregnant or may be pregnant. What happens before the procedure? No special preparation is needed. Eat and drink normally. What happens during the procedure?  In order to produce an image of your heart, gel will be applied to your chest and a wand-like tool (transducer)  will be moved over your chest. The gel will help transmit the sound waves from the transducer. The sound waves will harmlessly bounce off your heart to allow the heart images to be captured in real-time motion. These images will then be recorded.  You may need an IV to receive a medicine that improves the quality of the pictures. What happens after the procedure? You may return to your normal schedule including diet, activities, and medicines, unless your health care provider tells you otherwise. This information is not intended to replace advice given to you by your health care provider. Make sure you discuss any questions you have with your health care provider. Document Released: 11/18/2000 Document Revised: 07/09/2016 Document Reviewed: 07/29/2013 Elsevier Interactive Patient Education  2017 [employer]. Pharmacologic Stress Electrocardiogram A pharmacologic stress electrocardiogram is a heart (cardiac) test that uses nuclear imaging to evaluate the blood supply to your heart. This test may also be called a pharmacologic stress electrocardiography. Pharmacologic means that a medicine is used to increase your heart rate and blood pressure.  This stress test is done to find areas of poor blood flow to the heart by determining the extent of coronary artery disease (CAD). Some people exercise on a treadmill, which naturally increases the blood flow to the heart. For those people unable to exercise on a treadmill, a medicine is used. This medicine stimulates your heart and will cause your heart to beat harder and more quickly, as if you were exercising.  Pharmacologic stress tests can help determine:  The adequacy of blood flow to your heart during increased levels of activity in order to clear you for discharge home.  The extent of coronary artery blockage caused by CAD.  Your prognosis if you have suffered a heart attack.  The effectiveness of cardiac procedures done, such as an angioplasty,  which can increase the circulation in your coronary arteries.  Causes of chest pain or pressure. LET [facility] CARE PROVIDER KNOW ABOUT:  Any allergies you have.  All medicines you are taking, including vitamins, herbs, eye drops, creams, and over-the-counter medicines.  Previous problems you or members of your family have had with the use of anesthetics.  Any blood disorders you have.  Previous surgeries you have had.  Medical conditions you have.  Possibility of pregnancy, if this applies.  If you are currently breastfeeding. RISKS AND COMPLICATIONS Generally, this is a safe procedure. However, as with any procedure, complications can occur. Possible complications include:  You develop pain or pressure in the following areas:  Chest.  Jaw or neck.  Between your shoulder blades.  Radiating down your left arm.  Headache.  Dizziness or light-headedness.  Shortness of breath.  Increased or irregular heartbeat.  Low blood pressure.  Nausea or vomiting.  Flushing.  Redness going up the arm and slight pain during injection of medicine.  Heart attack (rare). BEFORE THE PROCEDURE   Avoid all forms of caffeine for 24 hours before your test or as directed by your health care provider. This includes coffee, tea (even decaffeinated tea), caffeinated sodas, chocolate, cocoa, and certain pain medicines.  Follow your health care provider's instructions regarding eating and drinking before the test.  Take your medicines as directed at regular times with water unless instructed otherwise. Exceptions may include:  If you have diabetes, ask how you are to take your insulin or pills. It is common to adjust insulin dosing the morning of the test.  If you are taking beta-blocker medicines, it is important to talk to your health care provider about these medicines well before the date of your test. Taking beta-blocker medicines may interfere with the test. In some cases, these  medicines need to be changed or stopped 24 hours or more before the test.  If you wear a nitroglycerin patch, it may need to be removed prior to the test. Ask your health care provider if the patch should be removed before the test.  If you use an inhaler for any breathing condition, bring it with you to the test.  If you are an outpatient, bring a snack so you can eat right after the stress phase of the test.  Do not smoke for 4 hours prior to the test or as directed by your health care provider.  Do not apply lotions, powders, creams, or oils on your chest prior to the test.  Wear comfortable shoes and clothing. Let your health care provider know if you were unable to complete or follow the preparations for your test. PROCEDURE   Multiple patches (electrodes) will be put on your chest. If needed, small areas of your chest may be shaved to get better contact with the electrodes. Once the electrodes are attached to your body, multiple wires will be attached to the electrodes, and your heart rate will be monitored.  An IV access will be started. A nuclear trace (isotope) is given. The isotope may be given intravenously, or it may be swallowed. Nuclear refers to several types of radioactive isotopes, and the nuclear isotope lights up the arteries so that the nuclear images are clear. The isotope is absorbed by your body. This results in low radiation exposure.  A resting nuclear image is taken to show how your heart functions at rest.  A medicine is given through the IV access.  A second scan is done about 1 hour after the medicine injection and determines how your heart functions under stress.  During this stress phase, you will be connected to an electrocardiogram machine. Your blood pressure and oxygen levels will be monitored. AFTER THE PROCEDURE   Your heart rate and blood pressure will be monitored after the test.  You may return to your normal schedule, including diet,activities,  and medicines, unless your health care provider tells you otherwise. This information is not intended to replace advice given to you by your health care provider. Make sure you discuss any questions you have with your health care provider. Document Released: 04/09/2009 Document Revised: 11/26/2013 Document Reviewed: 07/29/2013 Elsevier Interactive Patient Education  2017 [employer].

## 2016-12-09 ENCOUNTER — Ambulatory Visit (INDEPENDENT_AMBULATORY_CARE_PROVIDER_SITE_OTHER): Payer: Medicare Other

## 2016-12-09 ENCOUNTER — Other Ambulatory Visit: Payer: Self-pay

## 2016-12-09 DIAGNOSIS — R42 Dizziness and giddiness: Secondary | ICD-10-CM | POA: Diagnosis not present

## 2016-12-09 DIAGNOSIS — R9431 Abnormal electrocardiogram [ECG] [EKG]: Secondary | ICD-10-CM | POA: Diagnosis not present

## 2016-12-12 ENCOUNTER — Encounter
Admission: RE | Admit: 2016-12-12 | Discharge: 2016-12-12 | Disposition: A | Discharge status: Home / Self Care | Payer: Medicare Other | Source: Ambulatory Visit | Attending: Cardiology | Admitting: Cardiology

## 2016-12-12 DIAGNOSIS — R9431 Abnormal electrocardiogram [ECG] [EKG]: Secondary | ICD-10-CM | POA: Insufficient documentation

## 2016-12-12 DIAGNOSIS — R42 Dizziness and giddiness: Secondary | ICD-10-CM | POA: Diagnosis not present

## 2016-12-12 LAB — NM MYOCAR MULTI W/SPECT W/WALL MOTION / EF
CHL CUP MPHR: 141 {beats}/min
CHL CUP NUCLEAR SDS: 4
CSEPED: 0 min
Estimated workload: 1 METS
Exercise duration (sec): 0 s
LV sys vol: 24 mL
LVDIAVOL: 74 mL (ref 62–150)
Peak HR: 85 {beats}/min
Percent HR: 60 %
Rest HR: 60 {beats}/min
SRS: 11
SSS: 12
TID: 1.04

## 2016-12-12 MED ORDER — REGADENOSON 0.4 MG/5ML IV SOLN
0.4000 mg | Freq: Once | INTRAVENOUS | Status: AC
  Administered 2016-12-12: 0.4 mg via INTRAVENOUS

## 2016-12-12 MED ORDER — TECHNETIUM TC 99M TETROFOSMIN IV KIT
31.0610 | PACK | Freq: Once | INTRAVENOUS | Status: AC | PRN
  Administered 2016-12-12: 31.061 via INTRAVENOUS

## 2016-12-12 MED ORDER — TECHNETIUM TC 99M TETROFOSMIN IV KIT
13.0000 | PACK | Freq: Once | INTRAVENOUS | Status: AC | PRN
  Administered 2016-12-12: 12.459 via INTRAVENOUS

## 2016-12-15 ENCOUNTER — Encounter: Payer: Self-pay | Admitting: Cardiology

## 2016-12-15 ENCOUNTER — Ambulatory Visit (INDEPENDENT_AMBULATORY_CARE_PROVIDER_SITE_OTHER): Payer: Medicare Other | Admitting: Cardiology

## 2016-12-15 VITALS — BP 110/62 | HR 69 | Ht 66.0 in | Wt 198.0 lb

## 2016-12-15 DIAGNOSIS — I10 Essential (primary) hypertension: Secondary | ICD-10-CM

## 2016-12-15 DIAGNOSIS — R42 Dizziness and giddiness: Secondary | ICD-10-CM

## 2016-12-15 DIAGNOSIS — I77819 Aortic ectasia, unspecified site: Secondary | ICD-10-CM | POA: Diagnosis not present

## 2016-12-15 DIAGNOSIS — R9431 Abnormal electrocardiogram [ECG] [EKG]: Secondary | ICD-10-CM

## 2016-12-15 NOTE — Progress Notes (Signed)
Cardiology Office Note   Date:  12/15/2016   ID:  Gerald Barry, DOB [birth year], MRN [id number]  Referring Doctor:  Golden Pop, MD   Cardiologist:   Wende Bushy, MD   Reason for consultation:  Chief Complaint  Patient presents with  . OTHER    F/u echo and myoview. Meds reviewed verbally with pt.      History of Present Illness: Gerald Barry is a 82 y.o. male who presents for Follow-up after testing   Review of records show: Patient reports an episode back in November 2017. He was working out in the yard, cutting limb, when he had an episode of significant dizziness or lightheadedness. He did not pass out. He did throw up a little bit. He denies having chest pain or shortness breath at that time. Family was concerned and EMS was called. He was brought to the ER for further evaluation. He was found to be hypotensive with systolic blood pressure in the 80s. Also found to be dehydrated with elevated creatinine. He was managed in the ER with IV fluids and was discharged home. He followed up with his PCP and was concerned about bradycardia. Holter monitor was ordered. He was then sent to cardiology for further evaluation.  Currently, patient reports doing well. No chest pain. No no shortness of breath. No palpitations, headaches, loss of consciousness.  ROS:  Please see the history of present illness. Aside from mentioned under HPI, all other systems are reviewed and negative.    Past Medical History:  Diagnosis Date  . Arthritis   . Chronic kidney disease   . Eye cancer ([place])   . Eye cancer ([place])   . Hyperlipidemia   . Prostate cancer ([place])   . Vertigo, benign positional     Past Surgical History:  Procedure Laterality Date  . EYE SURGERY     cataract  . JOINT REPLACEMENT Left    knee  . SKIN GRAFT Left    hand     reports that he has never smoked. His smokeless tobacco use includes Chew. He reports that he does not drink alcohol or use drugs.   family  history includes Cancer in his mother.   Outpatient Medications Prior to Visit  Medication Sig Dispense Refill  . aspirin EC 81 MG tablet Take 81 mg by mouth daily.     . benazepril (LOTENSIN) 40 MG tablet Take 1 tablet (40 mg total) by mouth daily. 30 tablet 12  . hydrochlorothiazide (HYDRODIURIL) 12.5 MG tablet Take 1 tablet (12.5 mg total) by mouth daily. 30 tablet 12  . lovastatin (MEVACOR) 40 MG tablet Take 1 tablet (40 mg total) by mouth daily. (Patient taking differently: Take 40 mg by mouth at bedtime. ) 30 tablet 12  . pantoprazole (PROTONIX) 40 MG tablet Take 1 tablet (40 mg total) by mouth daily. 30 tablet 12   No facility-administered medications prior to visit.      Allergies: Patient has no known allergies.    PHYSICAL EXAM: VS:  BP 110/62 (BP Location: Left Arm, Patient Position: Sitting, Cuff Size: Normal)   Pulse 69   Ht 5\' 6"  (1.676 m)   Wt 198 lb (89.8 kg)   BMI 31.96 kg/m  , Body mass index is 31.96 kg/m. Wt Readings from Last 3 Encounters:  12/15/16 198 lb (89.8 kg)  12/07/16 199 lb 8 oz (90.5 kg)  10/31/16 195 lb 6.4 oz (88.6 kg)    GENERAL:  well developed, well nourished,  obese, not in acute distress HEENT: normocephalic, pink conjunctivae, anicteric sclerae, no xanthelasma, normal dentition, oropharynx clear NECK:  no neck vein engorgement, JVP normal, no hepatojugular reflux, carotid upstroke brisk and symmetric, no bruit, no thyromegaly, no lymphadenopathy LUNGS:  good respiratory effort, clear to auscultation bilaterally CV:  PMI not displaced, no thrills, no lifts, S1 and S2 within normal limits, no palpable S3 or S4, no murmurs, no rubs, no gallops ABD:  Soft, nontender, nondistended, normoactive bowel sounds, no abdominal aortic bruit, no hepatomegaly, no splenomegaly MS: nontender back, no kyphosis, no scoliosis, no joint deformities EXT:  2+ DP/PT pulses, no edema, no varicosities, no cyanosis, no clubbing SKIN: warm, nondiaphoretic, normal  turgor, no ulcers NEUROPSYCH: alert, oriented to person, place, and time, sensory/motor grossly intact, normal mood, appropriate affect    Recent Labs: 04/25/2016: TSH 2.130 10/07/2016: ALT 17; Hemoglobin 14.6 10/31/2016: BUN 25; Creatinine, Ser 1.43; Platelets 203; Potassium 4.4; Sodium 141   Lipid Panel    Component Value Date/Time   CHOL 126 04/25/2016 1408   CHOL 163 03/07/2016 1322   TRIG 89 04/25/2016 1408   TRIG 124 03/07/2016 1322   HDL 37 (L) 04/25/2016 1408   VLDL 25 03/07/2016 1322   LDLCALC 71 04/25/2016 1408     Other studies Reviewed:  EKG:  The ekg from01/02/2017 was personally reviewed by me and it revealed sinus rhythm, occasional PVCs. 67 BPM. LAFB.  Additional studies/ records that were reviewed personally reviewed by me today include:   Holter monitor 11/16/2016, read by Dr. Jeananne Rama Holter report and rhythm strips reviewed by undersigned Overall rhythm was sinus. Heart rate ranged from 40 T2 83. 40 bpm was at 1:42 AM. Average of 54 BPM. Ventricular ectopy revealed mostly single PVCs 3403, for ventricle couplets. No ventricular runs. Supraventricular ectopy consisted of 45 PACs, 6 atrial pairs, 2 atrial runs the longest of which was only 3 beats. No evidence of atrial fibrillation. No pauses greater than 3 seconds.  Echocardiogram 12/09/2016: Left ventricle: The cavity size was normal. Wall thickness was   normal. Systolic function was normal. The estimated ejection   fraction was in the range of 55% to 60%. Possible akinesis of the   basalinferoseptal myocardium. Doppler parameters are consistent   with abnormal left ventricular relaxation (grade 1 diastolic   dysfunction). - Aortic root: The aortic root was mildly dilated. - Ascending aorta: The ascending aorta was mildly dilated. - Left atrium: The atrium was mildly dilated. - Right ventricle: The cavity size was normal. Systolic function   was normal. - Right atrium: The atrium was mildly  dilated.  Nuclear stress is 12/12/2016:  There was no ST segment deviation noted during stress. PVCs noted with Lexiscan.  Defect 1: There is a medium defect of moderate severity present in the mid inferior and apical inferior location. This is likely due to diaphragm attenuation.  The study is normal.  This is a low risk study.  The left ventricular ejection fraction is normal (55-65%).  ASSESSMENT AND PLAN: Question of bradycardia As mentioned previously, heart rates dipping early in the morning hours while patient asleep, and again no persistent bradycardia less than 40 or significant pauses more than 3 seconds. No significant ventricular ectopy.  Abnormal EKG Risk factors for CAD include age, gender, hypertension Echocardiogram revealed normal EF. Stresses revealed no evidence of ischemia. These findings were discussed in detail with the patient. Patient reassured. Likelihood of clinically significant CAD is low.  Aortic root slightly dilated on echo Recommend repeat  echocardiogram in one year. Less than 4 cm on echo.  Hypertension BP is well controlled. Continue monitoring BP. Continue current medical therapy and lifestyle changes.  Episode of dizziness Likely related to hypotension and dehydration. No recurrence   Current medicines are reviewed at length with the patient today.  The patient does not have concerns regarding medicines.  Labs/ tests ordered today include:  Orders Placed This Encounter  Procedures  . ECHOCARDIOGRAM COMPLETE     I counseled the patient on importance of lifestyle modification including heart healthy diet, regular physical activity.  Disposition:   FU with undersigned prn  I spent at least 25 minutes with the patient today and more than 50% of the time was spent counseling the patient and coordinating care.     Signed, Wende Bushy, MD  12/15/2016 2:46 PM    [place]  This note was generated in part  with voice recognition software and I apologize for any typographical errors that were not detected and corrected.

## 2016-12-15 NOTE — Patient Instructions (Addendum)
Testing/Procedures: Your physician has requested that you have an echocardiogram in 1 year. Echocardiography is a painless test that uses sound waves to create images of your heart. It provides your doctor with information about the size and shape of your heart and how well your heart's chambers and valves are working. This procedure takes approximately one hour. There are no restrictions for this procedure.    Follow-Up: Your physician recommends that you schedule a follow-up appointment as needed with Dr. Yvone Neu. We will call you with results and if needed schedule follow up at that time.   It was a pleasure seeing you today here in the office. Please do not hesitate to give [place] a call back if you have any further questions. [place], BSN   Echocardiogram An echocardiogram, or echocardiography, uses sound waves (ultrasound) to produce an image of your heart. The echocardiogram is simple, painless, obtained within a short period of time, and offers valuable information to your health care provider. The images from an echocardiogram can provide information such as:  Evidence of coronary artery disease (CAD).  Heart size.  Heart muscle function.  Heart valve function.  Aneurysm detection.  Evidence of a past heart attack.  Fluid buildup around the heart.  Heart muscle thickening.  Assess heart valve function. Tell a health care provider about:  Any allergies you have.  All medicines you are taking, including vitamins, herbs, eye drops, creams, and over-the-counter medicines.  Any problems you or family members have had with anesthetic medicines.  Any blood disorders you have.  Any surgeries you have had.  Any medical conditions you have.  Whether you are pregnant or may be pregnant. What happens before the procedure? No special preparation is needed. Eat and drink normally. What happens during the procedure?  In order to produce an image of your  heart, gel will be applied to your chest and a wand-like tool (transducer) will be moved over your chest. The gel will help transmit the sound waves from the transducer. The sound waves will harmlessly bounce off your heart to allow the heart images to be captured in real-time motion. These images will then be recorded.  You may need an IV to receive a medicine that improves the quality of the pictures. What happens after the procedure? You may return to your normal schedule including diet, activities, and medicines, unless your health care provider tells you otherwise. This information is not intended to replace advice given to you by your health care provider. Make sure you discuss any questions you have with your health care provider. Document Released: 11/18/2000 Document Revised: 07/09/2016 Document Reviewed: 07/29/2013 Elsevier Interactive Patient Education  2017 [employer].

## 2016-12-27 ENCOUNTER — Other Ambulatory Visit: Payer: Medicare Other

## 2017-01-03 ENCOUNTER — Ambulatory Visit: Payer: Medicare Other | Admitting: Cardiology

## 2017-04-03 ENCOUNTER — Telehealth: Payer: Self-pay | Admitting: Family Medicine

## 2017-04-03 DIAGNOSIS — L989 Disorder of the skin and subcutaneous tissue, unspecified: Secondary | ICD-10-CM

## 2017-04-03 NOTE — Telephone Encounter (Signed)
Referral entered  

## 2017-04-03 NOTE — Telephone Encounter (Signed)
Ms Gerald Barry called and stated the pt had a spot on his back that believe may be cancer and he would like to know if he could get a referral to go to the dermatologist in [place] where he went the last time.

## 2017-04-03 NOTE — Telephone Encounter (Signed)
Routing to provider  

## 2017-04-03 NOTE — Telephone Encounter (Signed)
ok 

## 2017-04-05 DIAGNOSIS — C44519 Basal cell carcinoma of skin of other part of trunk: Secondary | ICD-10-CM | POA: Diagnosis not present

## 2017-04-05 DIAGNOSIS — Z85828 Personal history of other malignant neoplasm of skin: Secondary | ICD-10-CM | POA: Diagnosis not present

## 2017-04-05 DIAGNOSIS — D485 Neoplasm of uncertain behavior of skin: Secondary | ICD-10-CM | POA: Diagnosis not present

## 2017-04-05 DIAGNOSIS — C44311 Basal cell carcinoma of skin of nose: Secondary | ICD-10-CM | POA: Diagnosis not present

## 2017-04-05 DIAGNOSIS — L570 Actinic keratosis: Secondary | ICD-10-CM | POA: Diagnosis not present

## 2017-04-18 DIAGNOSIS — C4491 Basal cell carcinoma of skin, unspecified: Secondary | ICD-10-CM | POA: Diagnosis not present

## 2017-04-18 DIAGNOSIS — C44519 Basal cell carcinoma of skin of other part of trunk: Secondary | ICD-10-CM | POA: Diagnosis not present

## 2017-04-26 DIAGNOSIS — C4491 Basal cell carcinoma of skin, unspecified: Secondary | ICD-10-CM | POA: Diagnosis not present

## 2017-04-26 DIAGNOSIS — C44311 Basal cell carcinoma of skin of nose: Secondary | ICD-10-CM | POA: Diagnosis not present

## 2017-07-31 ENCOUNTER — Other Ambulatory Visit: Payer: Self-pay

## 2017-07-31 NOTE — Telephone Encounter (Signed)
Last OV: 10/31/16 Next OV: None on file.   BMP Latest Ref Rng & Units 10/31/2016 10/17/2016 10/07/2016  Glucose 65 - 99 mg/dL 100(H) 98 88  BUN 8 - 27 mg/dL 25 28(H) 23(H)  Creatinine 0.76 - 1.27 mg/dL 1.43(H) 1.53(H) 1.90(H)  BUN/Creat Ratio 10 - 24 17 18  -  Sodium 134 - 144 mmol/L 141 141 139  Potassium 3.5 - 5.2 mmol/L 4.4 4.1 4.4  Chloride 96 - 106 mmol/L 98 97 105  CO2 18 - 29 mmol/L 27 26 28   Calcium 8.6 - 10.2 mg/dL 10.2 9.3 8.8(L)     Lab Results  Component Value Date   CHOL 126 04/25/2016   HDL 37 (L) 04/25/2016   LDLCALC 71 04/25/2016   TRIG 89 04/25/2016   Lab Results  Component Value Date   CREATININE 1.43 (H) 10/31/2016   BUN 25 10/31/2016   NA 141 10/31/2016   K 4.4 10/31/2016   CL 98 10/31/2016   CO2 27 10/31/2016

## 2017-08-25 ENCOUNTER — Other Ambulatory Visit: Payer: Self-pay | Admitting: Family Medicine

## 2017-08-25 MED ORDER — HYDROCHLOROTHIAZIDE 12.5 MG PO TABS: 12.5000 mg | ORAL_TABLET | Freq: Every day | ORAL | 0 refills | Status: DC

## 2017-08-25 MED ORDER — ASPIRIN EC 81 MG PO TBEC: 81.0000 mg | DELAYED_RELEASE_TABLET | Freq: Every day | ORAL | 12 refills | Status: DC

## 2017-08-25 MED ORDER — LOVASTATIN 40 MG PO TABS: 40.0000 mg | ORAL_TABLET | Freq: Every day | ORAL | 0 refills | Status: DC

## 2017-08-25 MED ORDER — BENAZEPRIL HCL 40 MG PO TABS: 40.0000 mg | ORAL_TABLET | Freq: Every day | ORAL | 0 refills | Status: DC

## 2017-08-25 MED ORDER — PANTOPRAZOLE SODIUM 40 MG PO TBEC: 40.0000 mg | DELAYED_RELEASE_TABLET | Freq: Every day | ORAL | 0 refills | Status: DC

## 2017-08-25 NOTE — Telephone Encounter (Signed)
Patient needs refills on the following meds sent to [facility]  Aspirin EC 81mg  Benazepril (lotensin) 40mg  Hydrochlorothiazide (hydrodiuril) 125mg  Lovastatin (mevacor) 40mg  Pantoprazole (protonix) 40mg   If any questions please call [place] @ [phone]  Thank you

## 2017-08-25 NOTE — Telephone Encounter (Signed)
Routing to provider. Patient last seen 10/31/16, no f/up scheduled.

## 2017-08-25 NOTE — Telephone Encounter (Signed)
Spoke with patients wife scheduled fu appointment with Malachy Mood for 9/28 @ 2:30.  I explained that the patient would only get enough med refills to last until the fu appointment.

## 2017-08-25 NOTE — Telephone Encounter (Signed)
Needs an appointment. Will get him enough medicine to make it to appointment when it's booked.   

## 2017-08-28 ENCOUNTER — Telehealth: Payer: Self-pay | Admitting: Family Medicine

## 2017-08-28 NOTE — Telephone Encounter (Signed)
RX was sent over on Friday. Called and made wife aware. She stated pharmacy did not received. Called pharmacy and verified they did have it. They will get it ready.

## 2017-08-28 NOTE — Telephone Encounter (Signed)
Patient's husband is out of medication for lovastatin. Patient's husband needed to be seen for medication refill. Appointment scheduled for 09/12/2017 with Dr. Jeananne Rama.   Patient's wife would like a call back once prescription has been filled due to patient being out.  Please Advise,  Thank you

## 2017-09-01 ENCOUNTER — Ambulatory Visit: Payer: Medicare Other | Admitting: Unknown Physician Specialty

## 2017-09-12 ENCOUNTER — Ambulatory Visit (INDEPENDENT_AMBULATORY_CARE_PROVIDER_SITE_OTHER): Payer: Medicare Other | Admitting: Family Medicine

## 2017-09-12 ENCOUNTER — Encounter: Payer: Self-pay | Admitting: Family Medicine

## 2017-09-12 VITALS — BP 137/79 | HR 57 | Wt 186.0 lb

## 2017-09-12 DIAGNOSIS — Z23 Encounter for immunization: Secondary | ICD-10-CM | POA: Diagnosis not present

## 2017-09-12 DIAGNOSIS — C61 Malignant neoplasm of prostate: Secondary | ICD-10-CM | POA: Diagnosis not present

## 2017-09-12 DIAGNOSIS — I10 Essential (primary) hypertension: Secondary | ICD-10-CM

## 2017-09-12 DIAGNOSIS — H6122 Impacted cerumen, left ear: Secondary | ICD-10-CM | POA: Insufficient documentation

## 2017-09-12 DIAGNOSIS — E785 Hyperlipidemia, unspecified: Secondary | ICD-10-CM | POA: Diagnosis not present

## 2017-09-12 DIAGNOSIS — H6120 Impacted cerumen, unspecified ear: Secondary | ICD-10-CM | POA: Insufficient documentation

## 2017-09-12 MED ORDER — PANTOPRAZOLE SODIUM 40 MG PO TBEC: 40.0000 mg | DELAYED_RELEASE_TABLET | Freq: Every day | ORAL | 2 refills | Status: DC

## 2017-09-12 MED ORDER — BENAZEPRIL HCL 40 MG PO TABS: 40.0000 mg | ORAL_TABLET | Freq: Every day | ORAL | 2 refills | Status: DC

## 2017-09-12 MED ORDER — LOVASTATIN 40 MG PO TABS: 40.0000 mg | ORAL_TABLET | Freq: Every day | ORAL | 2 refills | Status: DC

## 2017-09-12 MED ORDER — HYDROCHLOROTHIAZIDE 12.5 MG PO TABS: 12.5000 mg | ORAL_TABLET | Freq: Every day | ORAL | 2 refills | Status: DC

## 2017-09-12 NOTE — Assessment & Plan Note (Signed)
Left ear cerumen impaction with partial removal but residual wax refer to ENT to further remove.

## 2017-09-12 NOTE — Assessment & Plan Note (Signed)
The current medical regimen is effective;  continue present plan and medications.  

## 2017-09-12 NOTE — Progress Notes (Signed)
BP 137/79   Pulse (!) 57   Wt 186 lb (84.4 kg)   SpO2 99%   BMI 30.02 kg/m    Subjective:    Patient ID: Gerald Barry, male    DOB: 1935-02-21, 82 y.o.   MRN: [id number]  HPI: Gerald Barry is a 82 y.o. male  Chief Complaint  Patient presents with  . Follow-up  Hypertension patient doing well with medications hasn't been in for some time to review. Takes blood pressure medicine without problems and good control. Also takes cholesterol medicine without problems and reflux medicine without problems and good control of symptoms.  Patient feels like left ear stopped up. Has decreased hearing.  Relevant past medical, surgical, family and social history reviewed and updated as indicated. Interim medical history since our last visit reviewed. Allergies and medications reviewed and updated.  Review of Systems  Constitutional: Negative.   Respiratory: Negative.   Cardiovascular: Negative.     Per HPI unless specifically indicated above     Objective:    BP 137/79   Pulse (!) 57   Wt 186 lb (84.4 kg)   SpO2 99%   BMI 30.02 kg/m   Wt Readings from Last 3 Encounters:  09/12/17 186 lb (84.4 kg)  12/15/16 198 lb (89.8 kg)  12/07/16 199 lb 8 oz (90.5 kg)    Physical Exam  Constitutional: He is oriented to person, place, and time. He appears well-developed and well-nourished.  HENT:  Head: Normocephalic and atraumatic.  Left ear with wax up against left tympanic membrane removed large portions of wax. Still some adjacent to the eardrum in process of removing wax traumatize canal and has become very tender. Still some decrease hearing and muffling will refer to ear nose and throat for rest of wax removal.  Eyes: Conjunctivae and EOM are normal.  Neck: Normal range of motion.  Cardiovascular: Normal rate, regular rhythm and normal heart sounds.   Pulmonary/Chest: Effort normal and breath sounds normal.  Musculoskeletal: Normal range of motion.  Neurological: He is alert  and oriented to person, place, and time.  Skin: No erythema.  Psychiatric: He has a normal mood and affect. His behavior is normal. Judgment and thought content normal.    Results for orders placed or performed during the hospital encounter of 12/12/16  NM Myocar Multi W/Spect W/Wall Motion / EF  Result Value Ref Range   Rest HR 60 bpm   Rest BP 133/75 mmHg   Exercise duration (sec) 0 sec   Percent HR 60 %   Exercise duration (min) 0 min   Estimated workload 1.0 METS   Peak HR 85 bpm   Peak BP 132/88 mmHg   MPHR 141 bpm   SSS 12    SRS 11    SDS 4    TID 1.04    LV sys vol 24 mL   LV dias vol 74 62 - 150 mL      Assessment & Plan:   Problem List Items Addressed This Visit      Cardiovascular and Mediastinum   Essential hypertension    The current medical regimen is effective;  continue present plan and medications.       Relevant Medications   benazepril (LOTENSIN) 40 MG tablet   hydrochlorothiazide (HYDRODIURIL) 12.5 MG tablet   lovastatin (MEVACOR) 40 MG tablet   Other Relevant Orders   Basic metabolic panel     Nervous and Auditory   Cerumen impaction    Left  ear cerumen impaction with partial removal but residual wax refer to ENT to further remove.        Genitourinary   Prostate cancer (Gerald Barry)     Other   Hyperlipidemia    The current medical regimen is effective;  continue present plan and medications.       Relevant Medications   benazepril (LOTENSIN) 40 MG tablet   hydrochlorothiazide (HYDRODIURIL) 12.5 MG tablet   lovastatin (MEVACOR) 40 MG tablet   Other Relevant Orders   Lipid panel   AST   ALT    Other Visit Diagnoses    Needs flu shot    -  Primary   Relevant Orders   Flu vaccine HIGH DOSE PF (Fluzone High dose) (Completed)       Follow up plan: Return for Physical Exam.

## 2017-09-13 ENCOUNTER — Telehealth: Payer: Self-pay | Admitting: Family Medicine

## 2017-09-13 DIAGNOSIS — N183 Chronic kidney disease, stage 3 unspecified: Secondary | ICD-10-CM

## 2017-09-13 LAB — BASIC METABOLIC PANEL
BUN/Creatinine Ratio: 13 (ref 10–24)
BUN: 25 mg/dL (ref 8–27)
CALCIUM: 9.7 mg/dL (ref 8.6–10.2)
CO2: 25 mmol/L (ref 20–29)
Chloride: 101 mmol/L (ref 96–106)
Creatinine, Ser: 1.94 mg/dL — ABNORMAL HIGH (ref 0.76–1.27)
GFR, EST AFRICAN AMERICAN: 37 mL/min/{1.73_m2} — AB (ref >59)
GFR, EST NON AFRICAN AMERICAN: 32 mL/min/{1.73_m2} — AB (ref >59)
Glucose: 91 mg/dL (ref 65–99)
Potassium: 3.9 mmol/L (ref 3.5–5.2)
Sodium: 142 mmol/L (ref 134–144)

## 2017-09-13 LAB — LIPID PANEL
CHOL/HDL RATIO: 3.4 ratio (ref 0.0–5.0)
Cholesterol, Total: 130 mg/dL (ref 100–199)
HDL: 38 mg/dL — AB (ref >39)
LDL Calculated: 72 mg/dL (ref 0–99)
Triglycerides: 101 mg/dL (ref 0–149)
VLDL CHOLESTEROL CAL: 20 mg/dL (ref 5–40)

## 2017-09-13 LAB — ALT: ALT: 11 IU/L (ref 0–44)

## 2017-09-13 LAB — AST: AST: 19 IU/L (ref 0–40)

## 2017-09-13 NOTE — Telephone Encounter (Signed)
Phone call Discussed with patient declining renal function patient taking aspirin every day patient will stop aspirin other nonsteroidal anti-inflammatory agents recheck BMP 1 month.

## 2017-09-25 ENCOUNTER — Other Ambulatory Visit: Payer: Medicare Other

## 2017-09-25 DIAGNOSIS — N183 Chronic kidney disease, stage 3 unspecified: Secondary | ICD-10-CM

## 2017-09-26 LAB — BASIC METABOLIC PANEL
BUN / CREAT RATIO: 14 (ref 10–24)
BUN: 24 mg/dL (ref 8–27)
CALCIUM: 9.5 mg/dL (ref 8.6–10.2)
CHLORIDE: 99 mmol/L (ref 96–106)
CO2: 25 mmol/L (ref 20–29)
CREATININE: 1.73 mg/dL — AB (ref 0.76–1.27)
GFR calc non Af Amer: 37 mL/min/{1.73_m2} — ABNORMAL LOW (ref >59)
GFR, EST AFRICAN AMERICAN: 42 mL/min/{1.73_m2} — AB (ref >59)
Glucose: 84 mg/dL (ref 65–99)
Potassium: 4 mmol/L (ref 3.5–5.2)
Sodium: 140 mmol/L (ref 134–144)

## 2017-09-27 ENCOUNTER — Encounter: Payer: Self-pay | Admitting: Family Medicine

## 2017-12-21 ENCOUNTER — Ambulatory Visit (INDEPENDENT_AMBULATORY_CARE_PROVIDER_SITE_OTHER): Payer: Medicare Other | Admitting: Family Medicine

## 2017-12-21 ENCOUNTER — Encounter: Payer: Self-pay | Admitting: Family Medicine

## 2017-12-21 VITALS — BP 122/70 | HR 38 | Ht 66.0 in | Wt 176.0 lb

## 2017-12-21 DIAGNOSIS — I10 Essential (primary) hypertension: Secondary | ICD-10-CM | POA: Diagnosis not present

## 2017-12-21 DIAGNOSIS — Z1329 Encounter for screening for other suspected endocrine disorder: Secondary | ICD-10-CM

## 2017-12-21 DIAGNOSIS — Z7189 Other specified counseling: Secondary | ICD-10-CM | POA: Insufficient documentation

## 2017-12-21 DIAGNOSIS — K219 Gastro-esophageal reflux disease without esophagitis: Secondary | ICD-10-CM | POA: Diagnosis not present

## 2017-12-21 DIAGNOSIS — C61 Malignant neoplasm of prostate: Secondary | ICD-10-CM | POA: Diagnosis not present

## 2017-12-21 DIAGNOSIS — Z0001 Encounter for general adult medical examination with abnormal findings: Secondary | ICD-10-CM | POA: Diagnosis not present

## 2017-12-21 DIAGNOSIS — E785 Hyperlipidemia, unspecified: Secondary | ICD-10-CM | POA: Diagnosis not present

## 2017-12-21 DIAGNOSIS — Z0000 Encounter for general adult medical examination without abnormal findings: Secondary | ICD-10-CM

## 2017-12-21 DIAGNOSIS — R002 Palpitations: Secondary | ICD-10-CM | POA: Diagnosis not present

## 2017-12-21 LAB — URINALYSIS, ROUTINE W REFLEX MICROSCOPIC
Bilirubin, UA: NEGATIVE
GLUCOSE, UA: NEGATIVE
Ketones, UA: NEGATIVE
Leukocytes, UA: NEGATIVE
Nitrite, UA: NEGATIVE
PH UA: 5.5 (ref 5.0–7.5)
PROTEIN UA: NEGATIVE
Specific Gravity, UA: 1.025 (ref 1.005–1.030)
UUROB: 0.2 mg/dL (ref 0.2–1.0)

## 2017-12-21 LAB — MICROSCOPIC EXAMINATION

## 2017-12-21 MED ORDER — HYDROCHLOROTHIAZIDE 12.5 MG PO TABS: 12.5000 mg | ORAL_TABLET | Freq: Every day | ORAL | 4 refills | Status: DC

## 2017-12-21 MED ORDER — LOVASTATIN 40 MG PO TABS: 40.0000 mg | ORAL_TABLET | Freq: Every day | ORAL | 4 refills | Status: DC

## 2017-12-21 MED ORDER — PANTOPRAZOLE SODIUM 40 MG PO TBEC: 40.0000 mg | DELAYED_RELEASE_TABLET | Freq: Every day | ORAL | 4 refills | Status: DC

## 2017-12-21 MED ORDER — BENAZEPRIL HCL 40 MG PO TABS: 40.0000 mg | ORAL_TABLET | Freq: Every day | ORAL | 4 refills | Status: DC

## 2017-12-21 NOTE — Assessment & Plan Note (Signed)
The current medical regimen is effective;  continue present plan and medications.  

## 2017-12-21 NOTE — Progress Notes (Signed)
BP 122/70   Pulse (!) 38   Ht 5\' 6"  (1.676 m)   Wt 176 lb (79.8 kg)   SpO2 99%   BMI 28.41 kg/m    Subjective:    Patient ID: Gerald Barry, male    DOB: January 26, 1935, 83 y.o.   MRN: [id number]  HPI: Gerald Barry is a 83 y.o. male  Annual exam Patient also follow-up hypertension taking medication without problems and good control of blood pressure. Same for cholesterol taking without problems and good control no side effects to any medication. Reflux well controlled with Protonix.    Relevant past medical, surgical, family and social history reviewed and updated as indicated. Interim medical history since our last visit reviewed. Allergies and medications reviewed and updated.  Review of Systems  Constitutional: Negative.   HENT: Negative.   Eyes: Negative.   Respiratory: Negative.   Cardiovascular: Negative.   Gastrointestinal: Negative.   Endocrine: Negative.   Genitourinary: Negative.   Musculoskeletal: Negative.   Skin: Negative.   Allergic/Immunologic: Negative.   Neurological: Negative.   Hematological: Negative.   Psychiatric/Behavioral: Negative.     Per HPI unless specifically indicated above     Objective:    BP 122/70   Pulse (!) 38   Ht 5\' 6"  (1.676 m)   Wt 176 lb (79.8 kg)   SpO2 99%   BMI 28.41 kg/m   Wt Readings from Last 3 Encounters:  12/21/17 176 lb (79.8 kg)  09/12/17 186 lb (84.4 kg)  12/15/16 198 lb (89.8 kg)    Physical Exam  Constitutional: He is oriented to person, place, and time. He appears well-developed and well-nourished.  HENT:  Head: Normocephalic.  Right Ear: External ear normal.  Left Ear: External ear normal.  Nose: Nose normal.  Eyes: Conjunctivae and EOM are normal. Pupils are equal, round, and reactive to light.  Neck: Normal range of motion. Neck supple. No thyromegaly present.  Cardiovascular: Normal rate, regular rhythm, normal heart sounds and intact distal pulses.  Pulmonary/Chest: Effort normal and  breath sounds normal.  Abdominal: Soft. Bowel sounds are normal. There is no splenomegaly or hepatomegaly.  Genitourinary:  Genitourinary Comments:  to be done at urology  Musculoskeletal: Normal range of motion.  Left hand with status post old trauma with multiple post trauma changes.  Lymphadenopathy:    He has no cervical adenopathy.  Neurological: He is alert and oriented to person, place, and time. He has normal reflexes.  Skin: Skin is warm and dry.  Psychiatric: He has a normal mood and affect. His behavior is normal. Judgment and thought content normal.    Results for orders placed or performed in visit on 09/38/18  Basic metabolic panel  Result Value Ref Range   Glucose 84 65 - 99 mg/dL   BUN 24 8 - 27 mg/dL   Creatinine, Ser 1.73 (H) 0.76 - 1.27 mg/dL   GFR calc non Af Amer 37 (L) >59 mL/min/1.73   GFR calc Af Amer 42 (L) >59 mL/min/1.73   BUN/Creatinine Ratio 14 10 - 24   Sodium 140 134 - 144 mmol/L   Potassium 4.0 3.5 - 5.2 mmol/L   Chloride 99 96 - 106 mmol/L   CO2 25 20 - 29 mmol/L   Calcium 9.5 8.6 - 10.2 mg/dL      Assessment & Plan:   Problem List Items Addressed This Visit      Cardiovascular and Mediastinum   Essential hypertension - Primary    The current  medical regimen is effective;  continue present plan and medications.       Relevant Medications   benazepril (LOTENSIN) 40 MG tablet   hydrochlorothiazide (HYDRODIURIL) 12.5 MG tablet   lovastatin (MEVACOR) 40 MG tablet   Other Relevant Orders   CBC with Differential/Platelet   Lipid panel   Comprehensive metabolic panel   Urinalysis, Routine w reflex microscopic     Digestive   Esophageal reflux    The current medical regimen is effective;  continue present plan and medications.       Relevant Medications   pantoprazole (PROTONIX) 40 MG tablet     Genitourinary   Prostate cancer ([place])    Followed by urology        Other   Hyperlipidemia    The current medical regimen is  effective;  continue present plan and medications.       Relevant Medications   benazepril (LOTENSIN) 40 MG tablet   hydrochlorothiazide (HYDRODIURIL) 12.5 MG tablet   lovastatin (MEVACOR) 40 MG tablet   Other Relevant Orders   CBC with Differential/Platelet   Lipid panel   Comprehensive metabolic panel   Urinalysis, Routine w reflex microscopic   Palpitations   Advanced care planning/counseling discussion    A voluntary discussion about advance care planning including the explanation and discussion of advance directives was extensively discussed  with the patient.  Explanation about the health care proxy and Living will was reviewed and packet with forms with explanation of how to fill them out was given.          Other Visit Diagnoses    Thyroid disorder screen       Relevant Orders   TSH   PE (physical exam), annual           Follow up plan: Return in about 6 months (around 06/20/2018) for BMP,  Lipids, ALT, AST.

## 2017-12-21 NOTE — Assessment & Plan Note (Signed)
Followed by urology.   

## 2017-12-21 NOTE — Assessment & Plan Note (Signed)
A voluntary discussion about advance care planning including the explanation and discussion of advance directives was extensively discussed  with the patient.  Explanation about the health care proxy and Living will was reviewed and packet with forms with explanation of how to fill them out was given.    

## 2017-12-22 LAB — LIPID PANEL
CHOLESTEROL TOTAL: 143 mg/dL (ref 100–199)
Chol/HDL Ratio: 3.4 ratio (ref 0.0–5.0)
HDL: 42 mg/dL (ref >39)
LDL Calculated: 84 mg/dL (ref 0–99)
Triglycerides: 85 mg/dL (ref 0–149)
VLDL CHOLESTEROL CAL: 17 mg/dL (ref 5–40)

## 2017-12-22 LAB — COMPREHENSIVE METABOLIC PANEL
ALBUMIN: 4.4 g/dL (ref 3.5–4.7)
ALK PHOS: 86 IU/L (ref 39–117)
ALT: 14 IU/L (ref 0–44)
AST: 18 IU/L (ref 0–40)
Albumin/Globulin Ratio: 1.6 (ref 1.2–2.2)
BUN / CREAT RATIO: 16 (ref 10–24)
BUN: 25 mg/dL (ref 8–27)
Bilirubin Total: 0.5 mg/dL (ref 0.0–1.2)
CO2: 25 mmol/L (ref 20–29)
Calcium: 9.8 mg/dL (ref 8.6–10.2)
Chloride: 102 mmol/L (ref 96–106)
Creatinine, Ser: 1.57 mg/dL — ABNORMAL HIGH (ref 0.76–1.27)
GFR, EST AFRICAN AMERICAN: 47 mL/min/{1.73_m2} — AB (ref >59)
GFR, EST NON AFRICAN AMERICAN: 41 mL/min/{1.73_m2} — AB (ref >59)
GLOBULIN, TOTAL: 2.8 g/dL (ref 1.5–4.5)
Glucose: 91 mg/dL (ref 65–99)
Potassium: 3.9 mmol/L (ref 3.5–5.2)
SODIUM: 142 mmol/L (ref 134–144)
Total Protein: 7.2 g/dL (ref 6.0–8.5)

## 2017-12-22 LAB — CBC WITH DIFFERENTIAL/PLATELET
BASOS ABS: 0 10*3/uL (ref 0.0–0.2)
Basos: 0 %
EOS (ABSOLUTE): 0.1 10*3/uL (ref 0.0–0.4)
Eos: 2 %
Hematocrit: 43.3 % (ref 37.5–51.0)
Hemoglobin: 13.9 g/dL (ref 13.0–17.7)
IMMATURE GRANS (ABS): 0 10*3/uL (ref 0.0–0.1)
IMMATURE GRANULOCYTES: 0 %
LYMPHS: 45 %
Lymphocytes Absolute: 3.4 10*3/uL — ABNORMAL HIGH (ref 0.7–3.1)
MCH: 28.9 pg (ref 26.6–33.0)
MCHC: 32.1 g/dL (ref 31.5–35.7)
MCV: 90 fL (ref 79–97)
Monocytes Absolute: 0.3 10*3/uL (ref 0.1–0.9)
Monocytes: 4 %
NEUTROS ABS: 3.7 10*3/uL (ref 1.4–7.0)
NEUTROS PCT: 49 %
PLATELETS: 185 10*3/uL (ref 150–379)
RBC: 4.81 x10E6/uL (ref 4.14–5.80)
RDW: 14.3 % (ref 12.3–15.4)
WBC: 7.6 10*3/uL (ref 3.4–10.8)

## 2017-12-22 LAB — TSH: TSH: 1.8 u[IU]/mL (ref 0.450–4.500)

## 2017-12-25 ENCOUNTER — Encounter: Payer: Self-pay | Admitting: Family Medicine

## 2018-03-26 ENCOUNTER — Ambulatory Visit: Payer: Self-pay | Admitting: *Deleted

## 2018-03-26 NOTE — Telephone Encounter (Signed)
Wife states her husband passed out last Thursday and hit his face on the right side and his cheek. He had a scratch there and was swollen. He told his wife that he was not having pain now and he has never passed out before.  He is requesting an appointment with his provider only.  Appointment made for next Tuesday. Pt's wife advised that if he passes out again, she needs to call 911 so the ems can assessed him.  Also to make sure that he is getting enough fluids to drink, other than tea. She voiced understanding.  [place] notified of pt wanting to wait to see a provider. Pt was put on the wait list for possible earlier appointment.  Reason for Disposition . [1] All other patients AND [2] now alert and feels fine(Exception: SIMPLE FAINT due to stress, pain, prolonged standing, or suddenly standing)  Answer Assessment - Initial Assessment Questions 1. ONSET: "How long were you unconscious?" (minutes) "When did it happen?"     Not sure. Happened last Thursday 2. CONTENT: "What happened during period of unconsciousness?" (e.g., seizure activity)      no 3. MENTAL STATUS: "Alert and oriented now?" (oriented x 3 = name, month, location)      no 4. TRIGGER: "What do you think caused the fainting?" "What were you doing just before you fainted?"  (e.g., exercise, sudden standing up, prolonged standing)     Not sure of the reasoning for blacking out. Vomited a little after coming thru. Just standing and working on a tractor. 5. RECURRENT SYMPTOM: "Have you ever passed out before?" If so, ask: "When was the last time?" and "What happened that time?"      no 6. INJURY: "Did you sustain any injury during the fall?"      Hit his head and cheek on right side. 7. CARDIAC SYMPTOMS: "Have you had any of the following symptoms: chest pain, difficulty breathing, palpitations?"     Not at that time 8. NEUROLOGIC SYMPTOMS: "Have you had any of the following symptoms: headache, numbness, vertigo,  weakness?"     Weakness 9. GI SYMPTOMS: "Have you had any of the following symptoms: abdominal pain, vomiting, diarrhea, blood in stools?"     Vomited right after he came to. 10. OTHER SYMPTOMS: "Do you have any other symptoms?"       no 11. PREGNANCY: "Is there any chance you are pregnant?" "When was your last menstrual period?"       n/a  Protocols used: [facility]

## 2018-04-03 ENCOUNTER — Encounter

## 2018-04-03 ENCOUNTER — Encounter: Payer: Self-pay | Admitting: Family Medicine

## 2018-04-03 ENCOUNTER — Ambulatory Visit (INDEPENDENT_AMBULATORY_CARE_PROVIDER_SITE_OTHER): Payer: Medicare Other | Admitting: Family Medicine

## 2018-04-03 DIAGNOSIS — R55 Syncope and collapse: Secondary | ICD-10-CM

## 2018-04-03 DIAGNOSIS — I10 Essential (primary) hypertension: Secondary | ICD-10-CM

## 2018-04-03 LAB — CBC WITH DIFFERENTIAL/PLATELET
Hematocrit: 40.6 % (ref 37.5–51.0)
Hemoglobin: 14.3 g/dL (ref 13.0–17.7)
LYMPHS ABS: 3.5 10*3/uL — AB (ref 0.7–3.1)
LYMPHS: 43 %
MCH: 30.6 pg (ref 26.6–33.0)
MCHC: 35.2 g/dL (ref 31.5–35.7)
MCV: 87 fL (ref 79–97)
MID (Absolute): 0.7 10*3/uL (ref 0.1–1.6)
MID: 9 %
NEUTROS ABS: 3.9 10*3/uL (ref 1.4–7.0)
NEUTROS PCT: 48 %
Platelets: 176 10*3/uL (ref 150–379)
RBC: 4.68 x10E6/uL (ref 4.14–5.80)
RDW: 14.1 % (ref 12.3–15.4)
WBC: 8.1 10*3/uL (ref 3.4–10.8)

## 2018-04-03 LAB — URINALYSIS, ROUTINE W REFLEX MICROSCOPIC
Bilirubin, UA: NEGATIVE
Glucose, UA: NEGATIVE
Leukocytes, UA: NEGATIVE
NITRITE UA: NEGATIVE
PH UA: 5 (ref 5.0–7.5)
Protein, UA: NEGATIVE
Specific Gravity, UA: 1.02 (ref 1.005–1.030)
Urobilinogen, Ur: 1 mg/dL (ref 0.2–1.0)

## 2018-04-03 LAB — MICROSCOPIC EXAMINATION: BACTERIA UA: NONE SEEN

## 2018-04-03 NOTE — Progress Notes (Signed)
BP 122/68   Pulse 90   Ht 5\' 6"  (1.676 m)   Wt 187 lb (84.8 kg)   SpO2 98%   BMI 30.18 kg/m    Subjective:    Patient ID: Gerald Barry, male    DOB: [birth year], 83 y.o.   MRN: [id number]  HPI: Gerald Barry is a 83 y.o. male  Chief Complaint  Patient presents with  . Fainted    Pt believes it was due to vertigo  Patient with an abrupt change in neurological status with a syncopal episode. Patient fainted 5 days ago.  Was out for just a few seconds. Occurred in the early afternoon patient had eaten breakfast but not lunch was working in his barn on a tractor.  Sat down on a tire and woke up on the ground attended by a friend.  His friend was essentially at his side prior to the event.  Afterwards the patient threw up and is been fine since except for some dizziness the rest of that day.  Prior to this episode the patient felt weak and dizzy. No prior episodes or previous episodes.  Patient fell on his right side of his face with some superficial abrasions which have already cleared up. Otherwise the patient has been doing well with no frequency urgency dysuria no change in urinary habits blood pressures been okay taking blood pressure medications without problems or issues. No blood in the stool or urine.  Relevant past medical, surgical, family and social history reviewed and updated as indicated. Interim medical history since our last visit reviewed. Allergies and medications reviewed and updated.  Review of Systems  Constitutional: Negative.   HENT: Negative.   Eyes: Negative.   Respiratory: Negative.   Cardiovascular: Negative.   Gastrointestinal: Negative.   Endocrine: Negative.   Genitourinary: Negative.   Musculoskeletal: Negative.   Skin: Negative.   Allergic/Immunologic: Negative.   Neurological: Negative.   Hematological: Negative.   Psychiatric/Behavioral: Negative.     Per HPI unless specifically indicated above     Objective:    BP 122/68    Pulse 90   Ht 5\' 6"  (1.676 m)   Wt 187 lb (84.8 kg)   SpO2 98%   BMI 30.18 kg/m   Wt Readings from Last 3 Encounters:  04/03/18 187 lb (84.8 kg)  12/21/17 176 lb (79.8 kg)  09/12/17 186 lb (84.4 kg)    Physical Exam  Constitutional: He is oriented to person, place, and time. He appears well-developed and well-nourished.  HENT:  Head: Normocephalic and atraumatic.  Right Ear: External ear normal.  Left Ear: External ear normal.  Nose: Nose normal.  Mouth/Throat: Oropharynx is clear and moist.  Eyes: Pupils are equal, round, and reactive to light. Conjunctivae and EOM are normal.  Neck: Normal range of motion.  Cardiovascular: Normal rate, regular rhythm and normal heart sounds.  Pulmonary/Chest: Effort normal and breath sounds normal.  Abdominal: Soft. Bowel sounds are normal. He exhibits no distension.  Musculoskeletal: Normal range of motion. He exhibits no edema or tenderness.  Lymphadenopathy:    He has no cervical adenopathy.  Neurological: He is alert and oriented to person, place, and time. He displays normal reflexes. No cranial nerve deficit. Coordination normal.  Skin: Skin is warm and dry. No erythema.  Psychiatric: He has a normal mood and affect. His behavior is normal. Judgment and thought content normal.    Results for orders placed or performed in visit on 12/21/17  Microscopic Examination  Result  Value Ref Range   WBC, UA 0-5 0 - 5 /hpf   RBC, UA 3-10 (A) 0 - 2 /hpf   Epithelial Cells (non renal) CANCELED    Casts Present None seen /lpf   Cast Type White cell casts (A) N/A   Mucus, UA Present Not Estab.   Bacteria, UA Few None seen/Few   Urinalysis Comments Red cell casts (A)   CBC with Differential/Platelet  Result Value Ref Range   WBC 7.6 3.4 - 10.8 x10E3/uL   RBC 4.81 4.14 - 5.80 x10E6/uL   Hemoglobin 13.9 13.0 - 17.7 g/dL   Hematocrit 43.3 37.5 - 51.0 %   MCV 90 79 - 97 fL   MCH 28.9 26.6 - 33.0 pg   MCHC 32.1 31.5 - 35.7 g/dL   RDW 14.3 12.3 -  15.4 %   Platelets 185 150 - 379 x10E3/uL   Neutrophils 49 Not Estab. %   Lymphs 45 Not Estab. %   Monocytes 4 Not Estab. %   Eos 2 Not Estab. %   Basos 0 Not Estab. %   Neutrophils Absolute 3.7 1.4 - 7.0 x10E3/uL   Lymphocytes Absolute 3.4 (H) 0.7 - 3.1 x10E3/uL   Monocytes Absolute 0.3 0.1 - 0.9 x10E3/uL   EOS (ABSOLUTE) 0.1 0.0 - 0.4 x10E3/uL   Basophils Absolute 0.0 0.0 - 0.2 x10E3/uL   Immature Granulocytes 0 Not Estab. %   Immature Grans (Abs) 0.0 0.0 - 0.1 x10E3/uL  Lipid panel  Result Value Ref Range   Cholesterol, Total 143 100 - 199 mg/dL   Triglycerides 85 0 - 149 mg/dL   HDL 42 >39 mg/dL   VLDL Cholesterol Cal 17 5 - 40 mg/dL   LDL Calculated 84 0 - 99 mg/dL   Chol/HDL Ratio 3.4 0.0 - 5.0 ratio  Comprehensive metabolic panel  Result Value Ref Range   Glucose 91 65 - 99 mg/dL   BUN 25 8 - 27 mg/dL   Creatinine, Ser 1.57 (H) 0.76 - 1.27 mg/dL   GFR calc non Af Amer 41 (L) >59 mL/min/1.73   GFR calc Af Amer 47 (L) >59 mL/min/1.73   BUN/Creatinine Ratio 16 10 - 24   Sodium 142 134 - 144 mmol/L   Potassium 3.9 3.5 - 5.2 mmol/L   Chloride 102 96 - 106 mmol/L   CO2 25 20 - 29 mmol/L   Calcium 9.8 8.6 - 10.2 mg/dL   Total Protein 7.2 6.0 - 8.5 g/dL   Albumin 4.4 3.5 - 4.7 g/dL   Globulin, Total 2.8 1.5 - 4.5 g/dL   Albumin/Globulin Ratio 1.6 1.2 - 2.2   Bilirubin Total 0.5 0.0 - 1.2 mg/dL   Alkaline Phosphatase 86 39 - 117 IU/L   AST 18 0 - 40 IU/L   ALT 14 0 - 44 IU/L  TSH  Result Value Ref Range   TSH 1.800 0.450 - 4.500 uIU/mL  Urinalysis, Routine w reflex microscopic  Result Value Ref Range   Specific Gravity, UA 1.025 1.005 - 1.030   pH, UA 5.5 5.0 - 7.5   Color, UA Yellow Yellow   Appearance Ur Hazy (A) Clear   Leukocytes, UA Negative Negative   Protein, UA Negative Negative/Trace   Glucose, UA Negative Negative   Ketones, UA Negative Negative   RBC, UA 3+ (A) Negative   Bilirubin, UA Negative Negative   Urobilinogen, Ur 0.2 0.2 - 1.0 mg/dL    Nitrite, UA Negative Negative   Microscopic Examination See below:  Assessment & Plan:   Problem List Items Addressed This Visit      Cardiovascular and Mediastinum   Essential hypertension    The current medical regimen is effective;  continue present plan and medications.       Syncope    EKG ordered and reviewed with sinus bradycardia.  Previous EKGs normal with some PVCs which were reviewed.  Patient noted to have a bradycardia episode earlier this year with pulse in the 30s.  Patient with no commentor, antibiotics regarding this pulse. CBC reviewed which was normal. Urinalysis reviewed which is okay. BMP pending. Discussed case with Dr. Wynetta Emery in our office. Syncope most likely due to orthostatic changes but concerned about bradycardia.  Will schedule for 24-hour EKG.       Relevant Orders   Urinalysis, Routine w reflex microscopic   CBC With Differential/Platelet   Basic metabolic panel   EKG 86-VHQI (Completed)   Holter monitor - 24 hour       Follow up plan: Return if symptoms worsen or fail to improve, for As scheduled.

## 2018-04-03 NOTE — Assessment & Plan Note (Signed)
EKG ordered and reviewed with sinus bradycardia.  Previous EKGs normal with some PVCs which were reviewed.  Patient noted to have a bradycardia episode earlier this year with pulse in the 30s.  Patient with no commentor, antibiotics regarding this pulse. CBC reviewed which was normal. Urinalysis reviewed which is okay. BMP pending. Discussed case with Dr. Wynetta Emery in our office. Syncope most likely due to orthostatic changes but concerned about bradycardia.  Will schedule for 24-hour EKG.

## 2018-04-03 NOTE — Assessment & Plan Note (Signed)
The current medical regimen is effective;  continue present plan and medications.  

## 2018-04-04 ENCOUNTER — Encounter: Payer: Self-pay | Admitting: Family Medicine

## 2018-04-04 LAB — BASIC METABOLIC PANEL
BUN / CREAT RATIO: 12 (ref 10–24)
BUN: 20 mg/dL (ref 8–27)
CHLORIDE: 101 mmol/L (ref 96–106)
CO2: 24 mmol/L (ref 20–29)
CREATININE: 1.65 mg/dL — AB (ref 0.76–1.27)
Calcium: 9.3 mg/dL (ref 8.6–10.2)
GFR calc Af Amer: 45 mL/min/{1.73_m2} — ABNORMAL LOW (ref >59)
GFR calc non Af Amer: 39 mL/min/{1.73_m2} — ABNORMAL LOW (ref >59)
GLUCOSE: 87 mg/dL (ref 65–99)
POTASSIUM: 4.3 mmol/L (ref 3.5–5.2)
SODIUM: 142 mmol/L (ref 134–144)

## 2018-04-13 ENCOUNTER — Telehealth: Payer: Self-pay | Admitting: Family Medicine

## 2018-04-13 NOTE — Telephone Encounter (Signed)
Called [facility], she was going to call and schedule holter w/ [place].

## 2018-04-13 NOTE — Telephone Encounter (Signed)
Copied from [place] [id number]. Topic: Quick Communication - See Telephone Encounter >> Apr 13, 2018  1:28 PM Rutherford Nail, [place] wrote: CRM for notification. See Telephone encounter for: 04/13/18. Patient's wife, [place], calling and states that at his last visit Dr Jeananne Rama told them that he wanted the patient to wear a heart monitor for a few days due to low heart rate. Patient's wife states they have not heard anything from anyone about getting that seet up. CB#: [phone]  or  [phone]

## 2018-04-18 ENCOUNTER — Ambulatory Visit (INDEPENDENT_AMBULATORY_CARE_PROVIDER_SITE_OTHER): Payer: Medicare Other

## 2018-04-18 DIAGNOSIS — R55 Syncope and collapse: Secondary | ICD-10-CM | POA: Diagnosis not present

## 2018-04-23 ENCOUNTER — Ambulatory Visit
Admission: RE | Admit: 2018-04-23 | Discharge: 2018-04-23 | Disposition: A | Discharge status: Home / Self Care | Payer: Medicare Other | Source: Ambulatory Visit | Attending: Cardiovascular Disease | Admitting: Cardiovascular Disease

## 2018-04-23 DIAGNOSIS — R55 Syncope and collapse: Secondary | ICD-10-CM | POA: Diagnosis present

## 2018-04-24 ENCOUNTER — Telehealth: Payer: Self-pay | Admitting: Family Medicine

## 2018-04-24 NOTE — Telephone Encounter (Signed)
Copied from [place] [id number]. Topic: General - Other >> Apr 24, 2018  9:44 AM Yvette Rack wrote: Reason for CRM: Patient's wife, [place], calling to get heart monitor results

## 2018-04-24 NOTE — Telephone Encounter (Signed)
Spoke with pts wife and let her know that the results have not came in yet.

## 2018-04-24 NOTE — Telephone Encounter (Signed)
Results are not available that I can see yet.

## 2018-04-26 ENCOUNTER — Telehealth: Payer: Self-pay | Admitting: Cardiovascular Disease

## 2018-04-26 NOTE — Telephone Encounter (Signed)
I'm still not seeing the results on this and it's been over a week- can we see when the results should be coming in?

## 2018-04-26 NOTE — Telephone Encounter (Signed)
Spoke with pts wife and let her know that we still have not received these results but we would be checking on them and giving her a call back when they were received. She wanted me to tell everyone involved that she appreciated everything that we were doing to get the results.

## 2018-04-26 NOTE — Telephone Encounter (Signed)
Looks like patient turned in holter monitor 04/23/2018.  Results have been scanned in yesterday afternoon but not interpreted.  Waiting for interpretation. Will call to check as well.

## 2018-04-26 NOTE — Telephone Encounter (Signed)
Dr. Rockey Situ,   The patient's holter is in your in basket to review.  Ordered by Dr. Jeananne Rama.

## 2018-04-26 NOTE — Telephone Encounter (Signed)
Spoke with Anderson Malta from [facility]. She is sending a message to the nurse about the report being read because it hasn't yet.

## 2018-04-26 NOTE — Telephone Encounter (Signed)
[facility] office calling to chack status of read report for holter.  Please route or upload when report is done.

## 2018-04-27 ENCOUNTER — Telehealth: Payer: Self-pay | Admitting: Family Medicine

## 2018-04-27 NOTE — Telephone Encounter (Signed)
Message relayed to patient. Verbalized understanding and denied questions.   

## 2018-04-27 NOTE — Telephone Encounter (Signed)
Please let him know that his heart monitor came back normal. He was in a normal rhythm throughout with a couple of very short fast beats and a couple of early beats. No sign of slow heart rate. Thanks!

## 2018-04-27 NOTE — Telephone Encounter (Signed)
See other telephone encounter. Results back and normal.

## 2018-05-01 NOTE — Telephone Encounter (Signed)
Patient has been notified of the results. See holter note.

## 2018-06-21 ENCOUNTER — Encounter: Payer: Self-pay | Admitting: Family Medicine

## 2018-06-21 ENCOUNTER — Ambulatory Visit (INDEPENDENT_AMBULATORY_CARE_PROVIDER_SITE_OTHER): Payer: Medicare Other | Admitting: Family Medicine

## 2018-06-21 VITALS — BP 104/60 | HR 115 | Ht 66.0 in | Wt 186.0 lb

## 2018-06-21 DIAGNOSIS — K219 Gastro-esophageal reflux disease without esophagitis: Secondary | ICD-10-CM | POA: Diagnosis not present

## 2018-06-21 DIAGNOSIS — I10 Essential (primary) hypertension: Secondary | ICD-10-CM

## 2018-06-21 DIAGNOSIS — E785 Hyperlipidemia, unspecified: Secondary | ICD-10-CM

## 2018-06-21 LAB — LP+ALT+AST PICCOLO, WAIVED
ALT (SGPT) Piccolo, Waived: 16 U/L (ref 10–47)
AST (SGOT) Piccolo, Waived: 23 U/L (ref 11–38)
CHOLESTEROL PICCOLO, WAIVED: 122 mg/dL (ref <200)
Chol/HDL Ratio Piccolo,Waive: 3 mg/dL
HDL CHOL PICCOLO, WAIVED: 41 mg/dL — AB (ref >59)
LDL Chol Calc Piccolo Waived: 62 mg/dL (ref <100)
Triglycerides Piccolo,Waived: 95 mg/dL (ref <150)
VLDL CHOL CALC PICCOLO,WAIVE: 19 mg/dL (ref <30)

## 2018-06-21 NOTE — Assessment & Plan Note (Signed)
The current medical regimen is effective;  continue present plan and medications.  

## 2018-06-21 NOTE — Progress Notes (Signed)
BP 104/60   Pulse (!) 115   Ht 5\' 6"  (1.676 m)   Wt 186 lb (84.4 kg)   SpO2 98%   BMI 30.02 kg/m    Subjective:    Patient ID: Gerald Barry, male    DOB: 10/13/1935, 83 y.o.   MRN: [id number]  HPI: Gerald Barry is a 83 y.o. male  Chief Complaint  Patient presents with  . Follow-up  . Hypertension  . Hyperlipidemia  . Leg Swelling    Weakening.   Patient all in all doing well good blood pressure control no issues with Benzapril hydrochlorothiazide. Taking cholesterol medicine without any problems taking faithfully. Reflux also doing well. Does have some neuropathy type symptoms in his legs which is been just gradually going on with some limited gait but otherwise stable. Discussed using a cane patient very reluctant discussed falling.  Relevant past medical, surgical, family and social history reviewed and updated as indicated. Interim medical history since our last visit reviewed. Allergies and medications reviewed and updated.  Review of Systems  Constitutional: Negative.   Respiratory: Negative.   Cardiovascular: Negative.     Per HPI unless specifically indicated above     Objective:    BP 104/60   Pulse (!) 115   Ht 5\' 6"  (1.676 m)   Wt 186 lb (84.4 kg)   SpO2 98%   BMI 30.02 kg/m   Wt Readings from Last 3 Encounters:  06/21/18 186 lb (84.4 kg)  04/03/18 187 lb (84.8 kg)  12/21/17 176 lb (79.8 kg)    Physical Exam  Constitutional: He is oriented to person, place, and time. He appears well-developed and well-nourished.  HENT:  Head: Normocephalic and atraumatic.  Eyes: Conjunctivae and EOM are normal.  Neck: Normal range of motion.  Cardiovascular: Normal rate, regular rhythm and normal heart sounds.  Pulmonary/Chest: Effort normal and breath sounds normal.  Musculoskeletal: Normal range of motion.  Neurological: He is alert and oriented to person, place, and time.  Skin: No erythema.  Psychiatric: He has a normal mood and affect. His  behavior is normal. Judgment and thought content normal.    Results for orders placed or performed in visit on 04/03/18  Microscopic Examination  Result Value Ref Range   WBC, UA 0-5 0 - 5 /hpf   RBC, UA 3-10 (A) 0 - 2 /hpf   Epithelial Cells (non renal) CANCELED    Casts Present None seen /lpf   Cast Type White cell casts (A) N/A   Mucus, UA Present Not Estab.   Bacteria, UA None seen None seen/Few  Urinalysis, Routine w reflex microscopic  Result Value Ref Range   Specific Gravity, UA 1.020 1.005 - 1.030   pH, UA 5.0 5.0 - 7.5   Color, UA Orange Yellow   Appearance Ur Hazy (A) Clear   Leukocytes, UA Negative Negative   Protein, UA Negative Negative/Trace   Glucose, UA Negative Negative   Ketones, UA Trace (A) Negative   RBC, UA 2+ (A) Negative   Bilirubin, UA Negative Negative   Urobilinogen, Ur 1.0 0.2 - 1.0 mg/dL   Nitrite, UA Negative Negative   Microscopic Examination See below:   CBC With Differential/Platelet  Result Value Ref Range   WBC 8.1 3.4 - 10.8 x10E3/uL   RBC 4.68 4.14 - 5.80 x10E6/uL   Hemoglobin 14.3 13.0 - 17.7 g/dL   Hematocrit 40.6 37.5 - 51.0 %   MCV 87 79 - 97 fL   MCH 30.6 26.6 -  33.0 pg   MCHC 35.2 31.5 - 35.7 g/dL   RDW 14.1 12.3 - 15.4 %   Platelets 176 150 - 379 x10E3/uL   Neutrophils 48 Not Estab. %   Lymphs 43 Not Estab. %   MID 9 Not Estab. %   Neutrophils Absolute 3.9 1.4 - 7.0 x10E3/uL   Lymphocytes Absolute 3.5 (H) 0.7 - 3.1 x10E3/uL   MID (Absolute) 0.7 0.1 - 1.6 I32P4/DI  Basic metabolic panel  Result Value Ref Range   Glucose 87 65 - 99 mg/dL   BUN 20 8 - 27 mg/dL   Creatinine, Ser 1.65 (H) 0.76 - 1.27 mg/dL   GFR calc non Af Amer 39 (L) >59 mL/min/1.73   GFR calc Af Amer 45 (L) >59 mL/min/1.73   BUN/Creatinine Ratio 12 10 - 24   Sodium 142 134 - 144 mmol/L   Potassium 4.3 3.5 - 5.2 mmol/L   Chloride 101 96 - 106 mmol/L   CO2 24 20 - 29 mmol/L   Calcium 9.3 8.6 - 10.2 mg/dL      Assessment & Plan:   Problem List Items  Addressed This Visit      Cardiovascular and Mediastinum   Essential hypertension - Primary    The current medical regimen is effective;  continue present plan and medications.       Relevant Orders   Basic metabolic panel   LP+ALT+AST [place], [place]     Digestive   Esophageal reflux    The current medical regimen is effective;  continue present plan and medications.         Other   Hyperlipidemia    The current medical regimen is effective;  continue present plan and medications.       Relevant Orders   Basic metabolic panel   LP+ALT+AST [place], Waived       Follow up plan: Return in about 6 months (around 12/22/2018) for Physical Exam.

## 2018-06-22 LAB — BASIC METABOLIC PANEL
BUN/Creatinine Ratio: 13 (ref 10–24)
BUN: 23 mg/dL (ref 8–27)
CALCIUM: 9.2 mg/dL (ref 8.6–10.2)
CHLORIDE: 99 mmol/L (ref 96–106)
CO2: 24 mmol/L (ref 20–29)
Creatinine, Ser: 1.82 mg/dL — ABNORMAL HIGH (ref 0.76–1.27)
GFR calc non Af Amer: 34 mL/min/{1.73_m2} — ABNORMAL LOW (ref >59)
GFR, EST AFRICAN AMERICAN: 40 mL/min/{1.73_m2} — AB (ref >59)
Glucose: 74 mg/dL (ref 65–99)
POTASSIUM: 4.1 mmol/L (ref 3.5–5.2)
Sodium: 139 mmol/L (ref 134–144)

## 2018-06-25 ENCOUNTER — Telehealth: Payer: Self-pay | Admitting: Family Medicine

## 2018-06-25 DIAGNOSIS — N183 Chronic kidney disease, stage 3 unspecified: Secondary | ICD-10-CM | POA: Insufficient documentation

## 2018-06-25 NOTE — Telephone Encounter (Signed)
Phone call Discussed with patient declining renal function patient not taking any aspirin or nonsteroidal medications will refer to nephrology.

## 2018-06-25 NOTE — Telephone Encounter (Signed)
-----   Message from Gerald Barry, [place] sent at 06/25/2018 11:37 AM EDT ----- Patient was transferred to provider for telephone conversation.

## 2018-06-25 NOTE — Assessment & Plan Note (Signed)
Declining renal function will refer to nephrology 

## 2018-08-02 ENCOUNTER — Other Ambulatory Visit: Payer: Self-pay | Admitting: Nephrology

## 2018-08-02 ENCOUNTER — Other Ambulatory Visit: Payer: Self-pay | Admitting: Internal Medicine

## 2018-08-02 DIAGNOSIS — N183 Chronic kidney disease, stage 3 unspecified: Secondary | ICD-10-CM

## 2018-08-02 DIAGNOSIS — I10 Essential (primary) hypertension: Secondary | ICD-10-CM | POA: Diagnosis not present

## 2018-08-07 ENCOUNTER — Ambulatory Visit
Admission: RE | Admit: 2018-08-07 | Discharge: 2018-08-07 | Disposition: A | Discharge status: Home / Self Care | Payer: Medicare Other | Source: Ambulatory Visit | Attending: Internal Medicine | Admitting: Internal Medicine

## 2018-08-07 DIAGNOSIS — N200 Calculus of kidney: Secondary | ICD-10-CM | POA: Diagnosis not present

## 2018-08-07 DIAGNOSIS — N183 Chronic kidney disease, stage 3 unspecified: Secondary | ICD-10-CM

## 2018-08-30 DIAGNOSIS — N2581 Secondary hyperparathyroidism of renal origin: Secondary | ICD-10-CM | POA: Diagnosis not present

## 2018-08-30 DIAGNOSIS — N183 Chronic kidney disease, stage 3 (moderate): Secondary | ICD-10-CM | POA: Diagnosis not present

## 2018-08-30 DIAGNOSIS — I10 Essential (primary) hypertension: Secondary | ICD-10-CM | POA: Diagnosis not present

## 2018-12-31 ENCOUNTER — Encounter: Payer: Medicare Other | Admitting: Family Medicine

## 2019-01-03 DIAGNOSIS — E871 Hypo-osmolality and hyponatremia: Secondary | ICD-10-CM | POA: Diagnosis not present

## 2019-01-03 DIAGNOSIS — I10 Essential (primary) hypertension: Secondary | ICD-10-CM | POA: Diagnosis not present

## 2019-01-03 DIAGNOSIS — N183 Chronic kidney disease, stage 3 (moderate): Secondary | ICD-10-CM | POA: Diagnosis not present

## 2019-01-03 DIAGNOSIS — D631 Anemia in chronic kidney disease: Secondary | ICD-10-CM | POA: Diagnosis not present

## 2019-03-11 IMAGING — US US RENAL
1 series · 14 of 25 positions shown · non-contrast
Comparison: CT abdomen pelvis 06/02/2014

CLINICAL DATA: Stage 3 chronic kidney disease

EXAM:
RENAL / URINARY TRACT ULTRASOUND COMPLETE

[Series 1: us renal · 0.28mm/px · 14 of 39 slices shown]
[im 1/39]
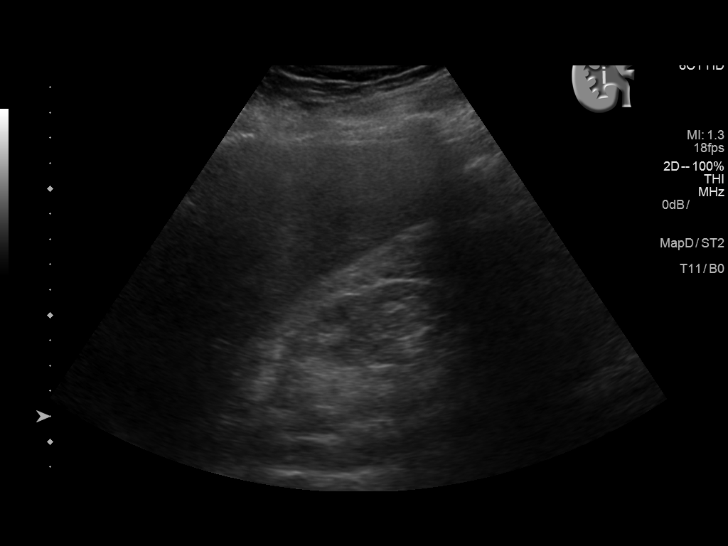
[im 4/39]
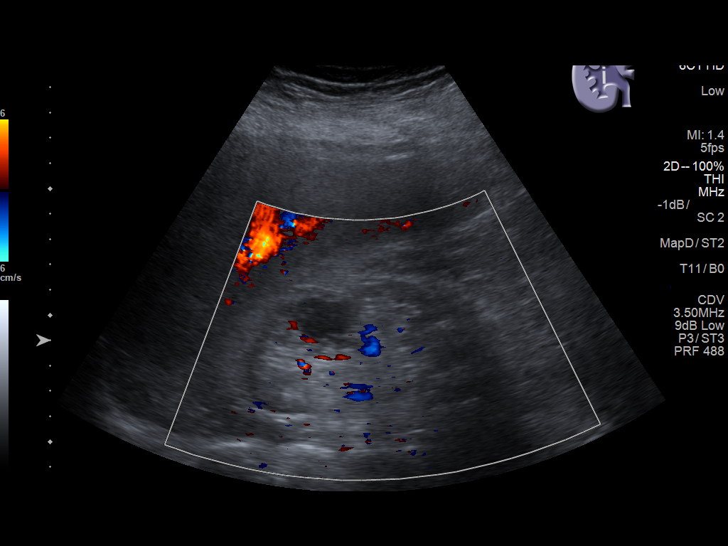
[im 7/39]
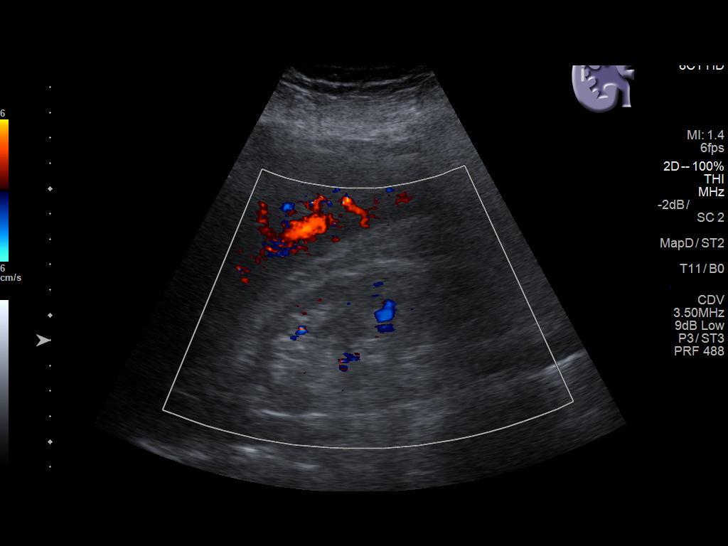
[im 10/39]
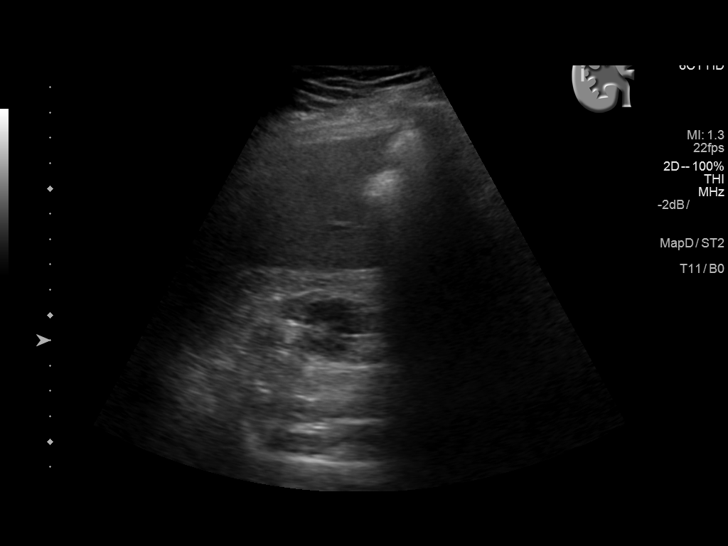
[im 13/39]
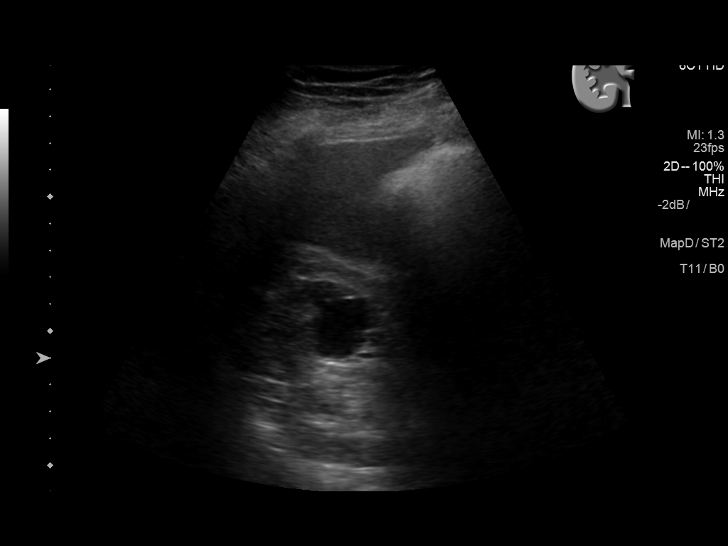
[im 15/39]
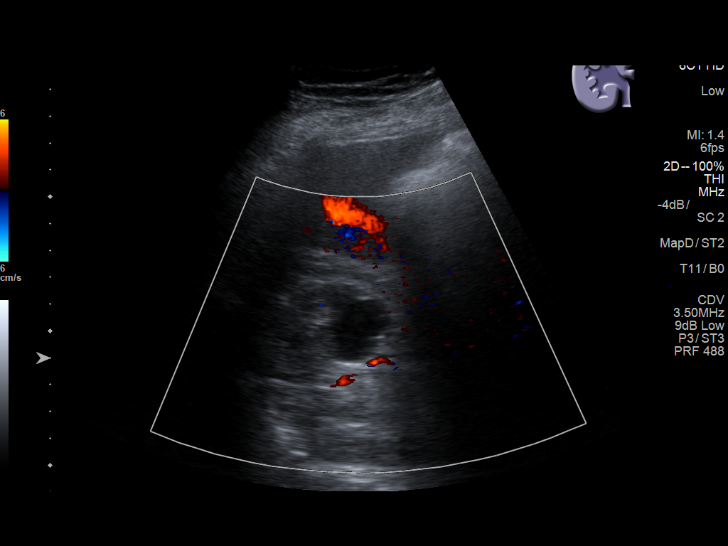
[im 18/39]
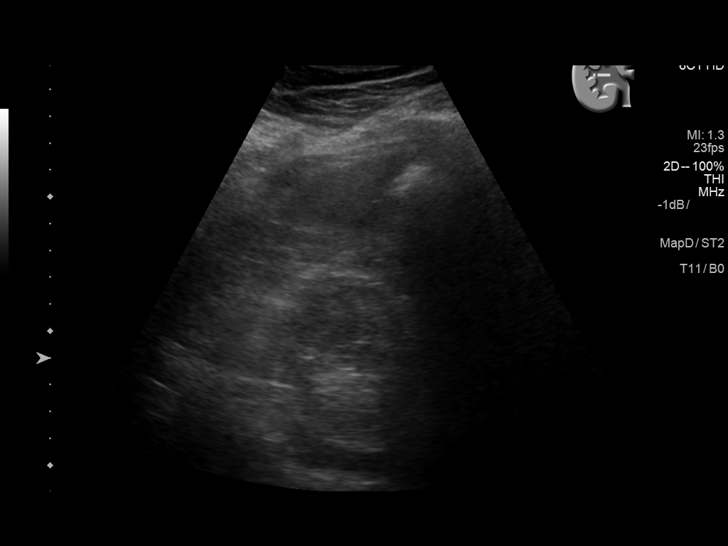
[im 21/39]
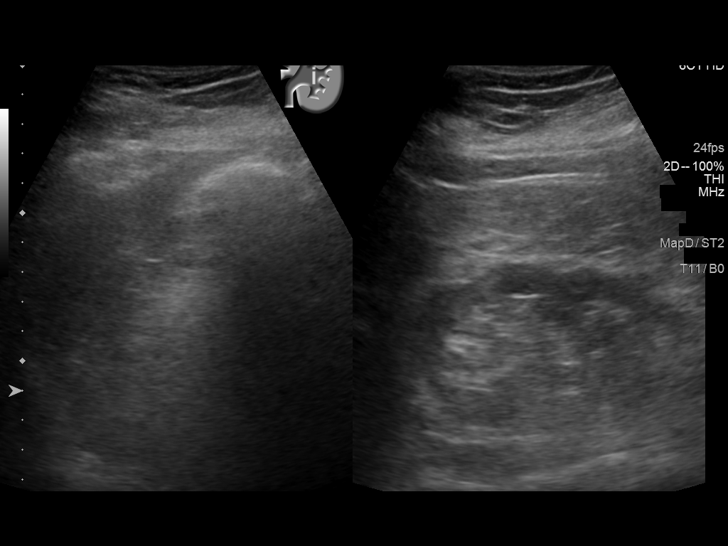
[im 24/39]
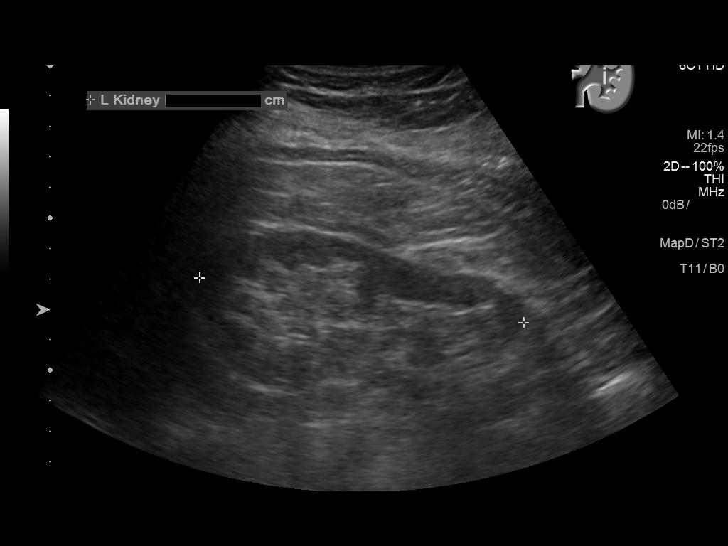
[im 26/39]
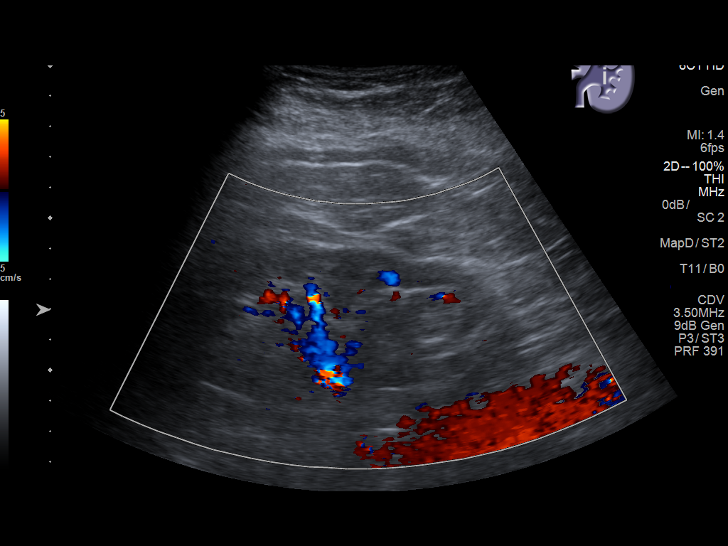
[im 29/39]
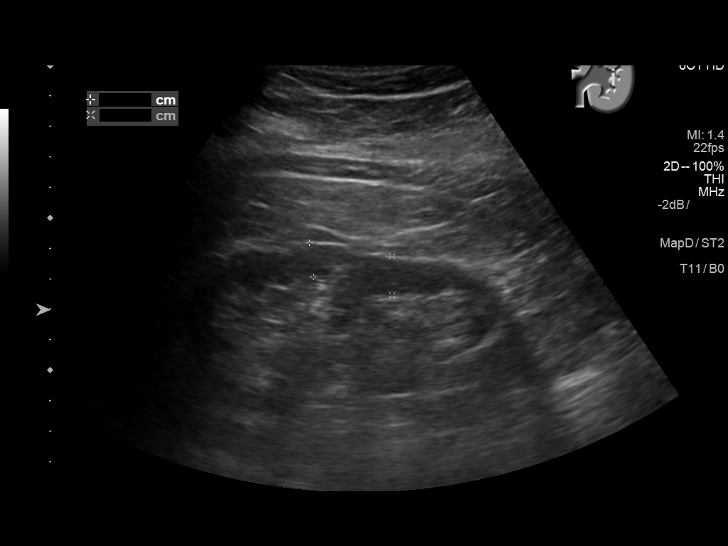
[im 32/39]
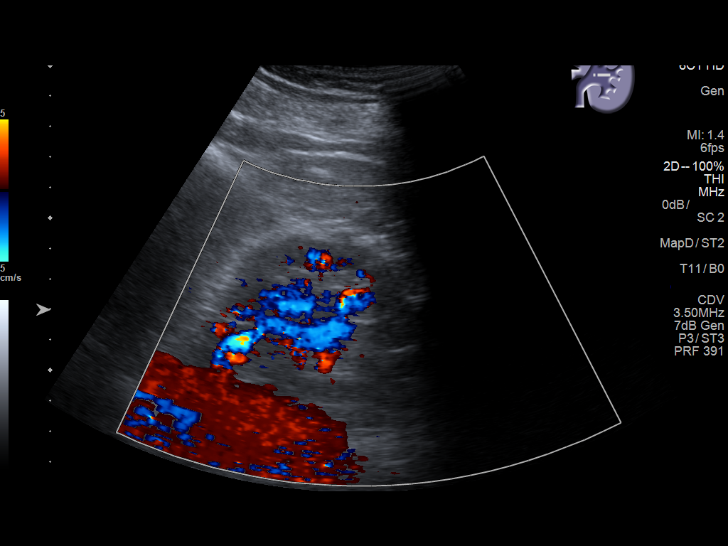
[im 35/39]
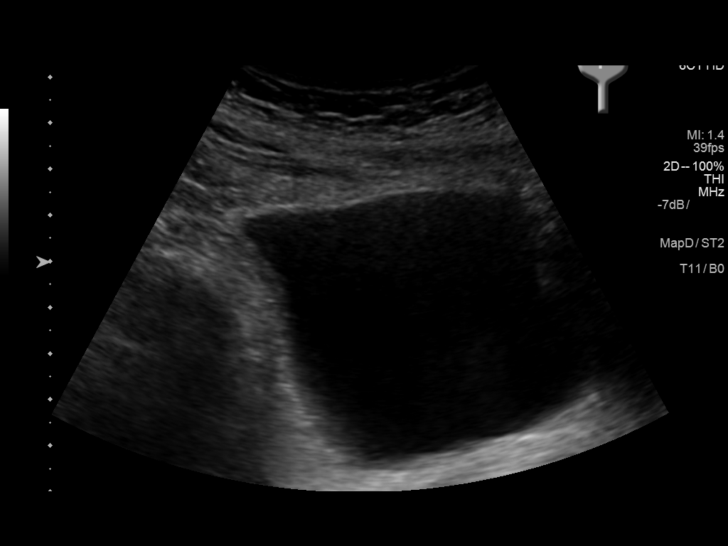
[im 39/39]
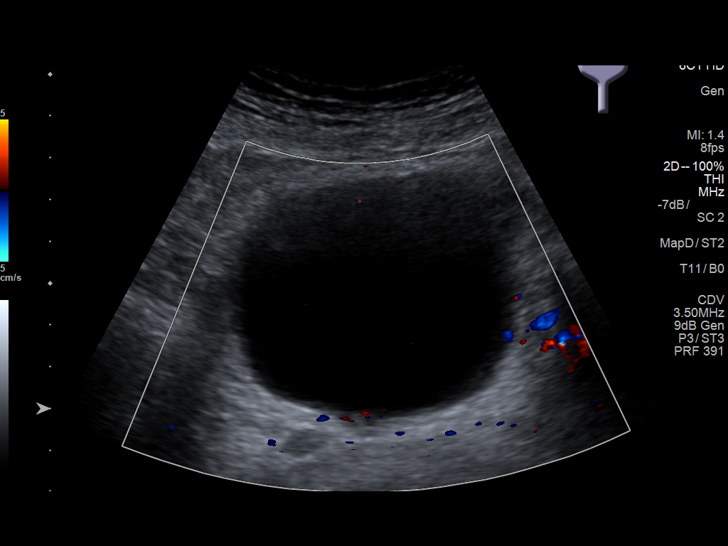

[14 of 25 positions shown; findings below may reference images not displayed]

FINDINGS: Right Kidney:

Length: 10.2 cm. 8 mm upper pole calculus. 2.5 cm upper pole cyst.
Echogenicity within normal limits. No mass or hydronephrosis
visualized.

Left Kidney:

Length: 10.7 cm. Echogenicity within normal limits. No mass or
hydronephrosis visualized.

Bladder:

Appears normal for degree of bladder distention.
IMPRESSION: Nonobstructing 8 mm right upper pole calculus. 2.5 cm right upper
pole cyst.

Normal renal size.  Negative for hydronephrosis.

## 2019-04-04 ENCOUNTER — Other Ambulatory Visit: Payer: Self-pay | Admitting: Family Medicine

## 2019-04-04 DIAGNOSIS — I10 Essential (primary) hypertension: Secondary | ICD-10-CM

## 2019-04-04 DIAGNOSIS — E785 Hyperlipidemia, unspecified: Secondary | ICD-10-CM

## 2019-04-04 NOTE — Telephone Encounter (Signed)
Requested medication (s) are due for refill today: yes  Requested medication (s) are on the active medication list: yes  Last refill:  12/21/17 for all 3 requests   Future visit scheduled: no  Notes to clinic:  Called pt. Unable to leave VM "mailbox has not been set up." > 3 months overdue   Requested Prescriptions  Pending Prescriptions Disp Refills   benazepril (LOTENSIN) 40 MG tablet [Pharmacy Med Name: BENAZEPRIL HCL 40 MG TABLET] 90 tablet 0    Sig: Take 1 tablet (40 mg total) by mouth daily.     Cardiovascular:  ACE Inhibitors Failed - 04/04/2019  9:17 AM      Failed - Cr in normal range and within 180 days    Creatinine  Date Value Ref Range Status  04/30/2013 1.30 0.60 - 1.30 mg/dL Final   Creatinine, Ser  Date Value Ref Range Status  06/21/2018 1.82 (H) 0.76 - 1.27 mg/dL Final         Failed - K in normal range and within 180 days    Potassium  Date Value Ref Range Status  06/21/2018 4.1 3.5 - 5.2 mmol/L Final  04/30/2013 3.6 3.5 - 5.1 mmol/L Final         Failed - Valid encounter within last 6 months    Recent Outpatient Visits          9 months ago Essential hypertension   [facility] [place], Jeannette How, MD   1 year ago Syncope, unspecified syncope type   [facility] [place], Jeannette How, MD   1 year ago Essential hypertension   [place], Jeannette How, MD   1 year ago Needs flu shot   [facility] [place], Jeannette How, MD   2 years ago Palpitations   [place], Jeannette How, MD             Passed - Patient is not pregnant      Passed - Last BP in normal range    BP Readings from Last 1 Encounters:  06/21/18 104/60        hydrochlorothiazide (HYDRODIURIL) 12.5 MG tablet [Pharmacy Med Name: HYDROCHLOROTHIAZIDE 12.5 MG TB] 90 tablet 0    Sig: Take 1 tablet (12.5 mg total) by mouth daily.     Cardiovascular: Diuretics - Thiazide Failed - 04/04/2019  9:17 AM      Failed - Cr in normal  range and within 360 days    Creatinine  Date Value Ref Range Status  04/30/2013 1.30 0.60 - 1.30 mg/dL Final   Creatinine, Ser  Date Value Ref Range Status  06/21/2018 1.82 (H) 0.76 - 1.27 mg/dL Final         Failed - Valid encounter within last 6 months    Recent Outpatient Visits          9 months ago Essential hypertension   [facility] [place], Jeannette How, MD   1 year ago Syncope, unspecified syncope type   [facility] [place], Jeannette How, MD   1 year ago Essential hypertension   [facility] [place], Jeannette How, MD   1 year ago Needs flu shot   [facility] [place], Jeannette How, MD   2 years ago Palpitations   [place], Jeannette How, MD             Passed - Ca in normal range and within 360 days    Calcium  Date  Value Ref Range Status  06/21/2018 9.2 8.6 - 10.2 mg/dL Final   Calcium, Total  Date Value Ref Range Status  04/30/2013 9.1 8.5 - 10.1 mg/dL Final         Passed - K in normal range and within 360 days    Potassium  Date Value Ref Range Status  06/21/2018 4.1 3.5 - 5.2 mmol/L Final  04/30/2013 3.6 3.5 - 5.1 mmol/L Final         Passed - Na in normal range and within 360 days    Sodium  Date Value Ref Range Status  06/21/2018 139 134 - 144 mmol/L Final  04/30/2013 139 136 - 145 mmol/L Final         Passed - Last BP in normal range    BP Readings from Last 1 Encounters:  06/21/18 104/60        lovastatin (MEVACOR) 40 MG tablet [Pharmacy Med Name: LOVASTATIN 40 MG TABLET] 90 tablet 0    Sig: Take 1 tablet (40 mg total) by mouth at bedtime.     Cardiovascular:  Antilipid - Statins Failed - 04/04/2019  9:17 AM      Failed - HDL in normal range and within 360 days    HDL  Date Value Ref Range Status  12/21/2017 42 >39 mg/dL Final         Passed - Total Cholesterol in normal range and within 360 days    Cholesterol Piccolo, Waived  Date Value Ref Range Status  06/21/2018 122 <200 mg/dL  Final    Comment:                            Desirable                <200                         Borderline High      200- 239                         High                     >239          Passed - LDL in normal range and within 360 days    LDL Calculated  Date Value Ref Range Status  12/21/2017 84 0 - 99 mg/dL Final         Passed - Triglycerides in normal range and within 360 days    Triglycerides Piccolo,Waived  Date Value Ref Range Status  06/21/2018 95 <150 mg/dL Final    Comment:                            Normal                   <150                         Borderline High     150 - 199                         High                200 - 499  Very High                >499          Passed - Patient is not pregnant      Passed - Valid encounter within last 12 months    Recent Outpatient Visits          9 months ago Essential hypertension   [facility] [place], Jeannette How, MD   1 year ago Syncope, unspecified syncope type   [facility] [place], Jeannette How, MD   1 year ago Essential hypertension   [place], Jeannette How, MD   1 year ago Needs flu shot   [facility] [place], Jeannette How, MD   2 years ago Palpitations   [place], Jeannette How, MD            pantoprazole (PROTONIX) 40 MG tablet [Pharmacy Med Name: PANTOPRAZOLE SOD DR 40 MG TAB] 90 tablet 0    Sig: Take 1 tablet (40 mg total) by mouth daily.     Gastroenterology: Proton Pump Inhibitors Passed - 04/04/2019  9:17 AM      Passed - Valid encounter within last 12 months    Recent Outpatient Visits          9 months ago Essential hypertension   [facility] Cr[place], Jeannette How, MD   1 year ago Syncope, unspecified syncope type   [facility] [place], Jeannette How, MD   1 year ago Essential hypertension   [facility] Cr[place], Jeannette How, MD   1 year ago Needs flu shot   [facility] [place], Jeannette How, MD   2 years ago Palpitations   [facility] [place], Jeannette How, MD

## 2019-07-01 DIAGNOSIS — N2581 Secondary hyperparathyroidism of renal origin: Secondary | ICD-10-CM | POA: Diagnosis not present

## 2019-07-01 DIAGNOSIS — D631 Anemia in chronic kidney disease: Secondary | ICD-10-CM | POA: Diagnosis not present

## 2019-07-01 DIAGNOSIS — I10 Essential (primary) hypertension: Secondary | ICD-10-CM | POA: Diagnosis not present

## 2019-07-01 DIAGNOSIS — E871 Hypo-osmolality and hyponatremia: Secondary | ICD-10-CM | POA: Diagnosis not present

## 2019-07-01 DIAGNOSIS — N183 Chronic kidney disease, stage 3 (moderate): Secondary | ICD-10-CM | POA: Diagnosis not present

## 2019-11-04 DIAGNOSIS — N1832 Chronic kidney disease, stage 3b: Secondary | ICD-10-CM | POA: Diagnosis not present

## 2019-11-04 DIAGNOSIS — I10 Essential (primary) hypertension: Secondary | ICD-10-CM | POA: Diagnosis not present

## 2019-11-04 DIAGNOSIS — N2581 Secondary hyperparathyroidism of renal origin: Secondary | ICD-10-CM | POA: Diagnosis not present

## 2019-11-07 DIAGNOSIS — H26491 Other secondary cataract, right eye: Secondary | ICD-10-CM | POA: Diagnosis not present

## 2019-11-22 DIAGNOSIS — H524 Presbyopia: Secondary | ICD-10-CM | POA: Diagnosis not present

## 2020-01-07 ENCOUNTER — Other Ambulatory Visit: Payer: Self-pay

## 2020-01-07 DIAGNOSIS — E785 Hyperlipidemia, unspecified: Secondary | ICD-10-CM

## 2020-01-07 DIAGNOSIS — I10 Essential (primary) hypertension: Secondary | ICD-10-CM

## 2020-01-07 MED ORDER — BENAZEPRIL HCL 40 MG PO TABS: 40.0000 mg | ORAL_TABLET | Freq: Every day | ORAL | 1 refills | Status: DC

## 2020-01-07 MED ORDER — PANTOPRAZOLE SODIUM 40 MG PO TBEC: 40.0000 mg | DELAYED_RELEASE_TABLET | Freq: Every day | ORAL | 0 refills | Status: DC

## 2020-01-07 MED ORDER — BENAZEPRIL HCL 40 MG PO TABS: 40.0000 mg | ORAL_TABLET | Freq: Every day | ORAL | 0 refills | Status: DC

## 2020-01-07 MED ORDER — LOVASTATIN 40 MG PO TABS: 40.0000 mg | ORAL_TABLET | Freq: Every day | ORAL | 1 refills | Status: DC

## 2020-01-07 MED ORDER — LOVASTATIN 40 MG PO TABS: 40.0000 mg | ORAL_TABLET | Freq: Every day | ORAL | 0 refills | Status: DC

## 2020-01-07 MED ORDER — HYDROCHLOROTHIAZIDE 12.5 MG PO TABS: 12.5000 mg | ORAL_TABLET | Freq: Every day | ORAL | 1 refills | Status: DC

## 2020-01-07 MED ORDER — HYDROCHLOROTHIAZIDE 12.5 MG PO TABS: 12.5000 mg | ORAL_TABLET | Freq: Every day | ORAL | 0 refills | Status: DC

## 2020-01-07 NOTE — Addendum Note (Signed)
Addended by: Valerie Roys on: 01/07/2020 01:44 PM   Modules accepted: Orders

## 2020-01-07 NOTE — Telephone Encounter (Signed)
Patient last seen 06/21/18.  Tried calling patient to let him know that he needs a follow up appointment. Sending patient a letter as well to call and schedule appointment.

## 2020-02-10 ENCOUNTER — Ambulatory Visit (INDEPENDENT_AMBULATORY_CARE_PROVIDER_SITE_OTHER): Payer: Medicare Other | Admitting: Nurse Practitioner

## 2020-02-10 ENCOUNTER — Encounter: Payer: Self-pay | Admitting: Nurse Practitioner

## 2020-02-10 ENCOUNTER — Ambulatory Visit (INDEPENDENT_AMBULATORY_CARE_PROVIDER_SITE_OTHER): Payer: Medicare Other

## 2020-02-10 ENCOUNTER — Other Ambulatory Visit: Payer: Self-pay

## 2020-02-10 VITALS — BP 118/67 | HR 59 | Temp 98.1°F

## 2020-02-10 VITALS — BP 118/67 | HR 59 | Temp 98.1°F | Resp 15 | Ht 66.0 in | Wt 183.0 lb

## 2020-02-10 DIAGNOSIS — N1832 Chronic kidney disease, stage 3b: Secondary | ICD-10-CM | POA: Diagnosis not present

## 2020-02-10 DIAGNOSIS — H6122 Impacted cerumen, left ear: Secondary | ICD-10-CM

## 2020-02-10 DIAGNOSIS — Z1329 Encounter for screening for other suspected endocrine disorder: Secondary | ICD-10-CM

## 2020-02-10 DIAGNOSIS — I10 Essential (primary) hypertension: Secondary | ICD-10-CM | POA: Diagnosis not present

## 2020-02-10 DIAGNOSIS — Z0000 Encounter for general adult medical examination without abnormal findings: Secondary | ICD-10-CM | POA: Diagnosis not present

## 2020-02-10 DIAGNOSIS — E785 Hyperlipidemia, unspecified: Secondary | ICD-10-CM | POA: Diagnosis not present

## 2020-02-10 DIAGNOSIS — K219 Gastro-esophageal reflux disease without esophagitis: Secondary | ICD-10-CM | POA: Diagnosis not present

## 2020-02-10 DIAGNOSIS — K5900 Constipation, unspecified: Secondary | ICD-10-CM | POA: Insufficient documentation

## 2020-02-10 LAB — BAYER DCA HB A1C WAIVED: HB A1C (BAYER DCA - WAIVED): 5.2 % (ref <7.0)

## 2020-02-10 MED ORDER — BENAZEPRIL HCL 40 MG PO TABS: 40.0000 mg | ORAL_TABLET | Freq: Every day | ORAL | 1 refills | Status: DC

## 2020-02-10 MED ORDER — HYDROCHLOROTHIAZIDE 12.5 MG PO TABS: 12.5000 mg | ORAL_TABLET | Freq: Every day | ORAL | 1 refills | Status: DC

## 2020-02-10 MED ORDER — LOVASTATIN 40 MG PO TABS: 40.0000 mg | ORAL_TABLET | Freq: Every day | ORAL | 1 refills | Status: DC

## 2020-02-10 MED ORDER — GLYCERIN (ADULT) 2 G RE SUPP: 1.0000 | RECTAL | 0 refills | Status: DC | PRN

## 2020-02-10 MED ORDER — PANTOPRAZOLE SODIUM 40 MG PO TBEC: 40.0000 mg | DELAYED_RELEASE_TABLET | Freq: Every day | ORAL | 1 refills | Status: DC

## 2020-02-10 MED ORDER — POLYETHYLENE GLYCOL 3350 17 GM/SCOOP PO POWD: 17.0000 g | Freq: Every day | ORAL | 1 refills | Status: DC

## 2020-02-10 NOTE — Patient Instructions (Addendum)
We will call you with the results of your labs tomorrow or Wednesday.  Nice meeting you today, take care and see you in 6 months!  - Jeniyah Menor  Preventive Care 15 Years and Older, Male Preventive care refers to lifestyle choices and visits with your health care provider that can promote health and wellness. This includes:  A yearly physical exam. This is also called an annual well check.  Regular dental and eye exams.  Immunizations.  Screening for certain conditions.  Healthy lifestyle choices, such as diet and exercise. What can I expect for my preventive care visit? Physical exam Your health care provider will check:  Height and weight. These may be used to calculate body mass index (BMI), which is a measurement that tells if you are at a healthy weight.  Heart rate and blood pressure.  Your skin for abnormal spots. Counseling Your health care provider may ask you questions about:  Alcohol, tobacco, and drug use.  Emotional well-being.  Home and relationship well-being.  Sexual activity.  Eating habits.  History of falls.  Memory and ability to understand (cognition).  Work and work Statistician. What immunizations do I need?  Influenza (flu) vaccine  This is recommended every year. Tetanus, diphtheria, and pertussis (Tdap) vaccine  You may need a Td booster every 10 years. Varicella (chickenpox) vaccine  You may need this vaccine if you have not already been vaccinated. Zoster (shingles) vaccine  You may need this after age 4. Pneumococcal conjugate (PCV13) vaccine  One dose is recommended after age 21. Pneumococcal polysaccharide (PPSV23) vaccine  One dose is recommended after age 64. Measles, mumps, and rubella (MMR) vaccine  You may need at least one dose of MMR if you were born in 1957 or later. You may also need a second dose. Meningococcal conjugate (MenACWY) vaccine  You may need this if you have certain conditions. Hepatitis A  vaccine  You may need this if you have certain conditions or if you travel or work in places where you may be exposed to hepatitis A. Hepatitis B vaccine  You may need this if you have certain conditions or if you travel or work in places where you may be exposed to hepatitis B. Haemophilus influenzae type b (Hib) vaccine  You may need this if you have certain conditions. You may receive vaccines as individual doses or as more than one vaccine together in one shot (combination vaccines). Talk with your health care provider about the risks and benefits of combination vaccines. What tests do I need? Blood tests  Lipid and cholesterol levels. These may be checked every 5 years, or more frequently depending on your overall health.  Hepatitis C test.  Hepatitis B test. Screening  Lung cancer screening. You may have this screening every year starting at age 48 if you have a 30-pack-year history of smoking and currently smoke or have quit within the past 15 years.  Colorectal cancer screening. All adults should have this screening starting at age 53 and continuing until age 79. Your health care provider may recommend screening at age 35 if you are at increased risk. You will have tests every 1-10 years, depending on your results and the type of screening test.  Prostate cancer screening. Recommendations will vary depending on your family history and other risks.  Diabetes screening. This is done by checking your blood sugar (glucose) after you have not eaten for a while (fasting). You may have this done every 1-3 years.  Abdominal aortic aneurysm (  AAA) screening. You may need this if you are a current or former smoker.  Sexually transmitted disease (STD) testing. Follow these instructions at home: Eating and drinking  Eat a diet that includes fresh fruits and vegetables, whole grains, lean protein, and low-fat dairy products. Limit your intake of foods with high amounts of sugar, saturated  fats, and salt.  Take vitamin and mineral supplements as recommended by your health care provider.  Do not drink alcohol if your health care provider tells you not to drink.  If you drink alcohol: ? Limit how much you have to 0-2 drinks a day. ? Be aware of how much alcohol is in your drink. In the [place] one drink equals one 12 oz bottle of beer (355 mL), one 5 oz glass of wine (148 mL), or one 1 oz glass of hard liquor (44 mL). Lifestyle  Take daily care of your teeth and gums.  Stay active. Exercise for at least 30 minutes on 5 or more days each week.  Do not use any products that contain nicotine or tobacco, such as cigarettes, e-cigarettes, and chewing tobacco. If you need help quitting, ask your health care provider.  If you are sexually active, practice safe sex. Use a condom or other form of protection to prevent STIs (sexually transmitted infections).  Talk with your health care provider about taking a low-dose aspirin or statin. What's next?  Visit your health care provider once a year for a well check visit.  Ask your health care provider how often you should have your eyes and teeth checked.  Stay up to date on all vaccines. This information is not intended to replace advice given to you by your health care provider. Make sure you discuss any questions you have with your health care provider. Document Revised: 11/15/2018 Document Reviewed: 11/15/2018 Elsevier Patient Education  [place].  Constipation, Adult Constipation is when a person has fewer bowel movements in a week than normal, has difficulty having a bowel movement, or has stools that are dry, hard, or larger than normal. Constipation may be caused by an underlying condition. It may become worse with age if a person takes certain medicines and does not take in enough fluids. Follow these instructions at home: Eating and drinking   Eat foods that have a lot of fiber, such as fresh fruits and  vegetables, whole grains, and beans.  Limit foods that are high in fat, low in fiber, or overly processed, such as french fries, hamburgers, cookies, candies, and soda.  Drink enough fluid to keep your urine clear or pale yellow. General instructions  Exercise regularly or as told by your health care provider.  Go to the restroom when you have the urge to go. Do not hold it in.  Take over-the-counter and prescription medicines only as told by your health care provider. These include any fiber supplements.  Practice pelvic floor retraining exercises, such as deep breathing while relaxing the lower abdomen and pelvic floor relaxation during bowel movements.  Watch your condition for any changes.  Keep all follow-up visits as told by your health care provider. This is important. Contact a health care provider if:  You have pain that gets worse.  You have a fever.  You do not have a bowel movement after 4 days.  You vomit.  You are not hungry.  You lose weight.  You are bleeding from the anus.  You have thin, pencil-like stools. Get help right away if:  You  have a fever and your symptoms suddenly get worse.  You leak stool or have blood in your stool.  Your abdomen is bloated.  You have severe pain in your abdomen.  You feel dizzy or you faint. This information is not intended to replace advice given to you by your health care provider. Make sure you discuss any questions you have with your health care provider. Document Revised: 11/03/2017 Document Reviewed: 05/11/2016 Elsevier Patient Education  2020 [employer].

## 2020-02-10 NOTE — Assessment & Plan Note (Signed)
Chronic, stable.  Follows with Nephrology, last visit Nov. 2020; GFR was stable.  Encouraged to stay hydrated with water and limit tea intake.  Will check CMP and CBC today.

## 2020-02-10 NOTE — Assessment & Plan Note (Signed)
Left ear flushed with about 25% removal of cerumen.  Denied lightheadedness or dizziness after removal.  Patient did not desire further removal today.  Declined referral to ENT.  Stated he would return to clinic in future to have removed.

## 2020-02-10 NOTE — Assessment & Plan Note (Addendum)
Chronic, stable.  BP well controlled in office today.  Continue benzaepril.  Refills sent to pharmacy.  CBC and CMP checked today, will follow.

## 2020-02-10 NOTE — Patient Instructions (Signed)
Gerald Barry , Thank you for taking time to come for your Medicare Wellness Visit. I appreciate your ongoing commitment to your health goals. Please review the following plan we discussed and let me know if I can assist you in the future.   Screening recommendations/referrals: Colonoscopy: no longer required  Recommended yearly ophthalmology/optometry visit for glaucoma screening and checkup Recommended yearly dental visit for hygiene and checkup  Vaccinations: Influenza vaccine: declined  Pneumococcal vaccine: up to date Tdap vaccine: up to date  Shingles vaccine: shingrix eligible    Covid-19:declined   Advanced directives: Advance directive discussed with you today. I have provided a copy for you to complete at home and have notarized. Once this is complete please bring a copy in to our office so we can scan it into your chart.  Conditions/risks identified: fall risk   Next appointment: Follow up in one year for your annual wellness visit   Preventive Care 65 Years and Older, Male Preventive care refers to lifestyle choices and visits with your health care provider that can promote health and wellness. What does preventive care include?  A yearly physical exam. This is also called an annual well check.  Dental exams once or twice a year.  Routine eye exams. Ask your health care provider how often you should have your eyes checked.  Personal lifestyle choices, including:  Daily care of your teeth and gums.  Regular physical activity.  Eating a healthy diet.  Avoiding tobacco and drug use.  Limiting alcohol use.  Practicing safe sex.  Taking low doses of aspirin every day.  Taking vitamin and mineral supplements as recommended by your health care provider. What happens during an annual well check? The services and screenings done by your health care provider during your annual well check will depend on your age, overall health, lifestyle risk factors, and family  history of disease. Counseling  Your health care provider may ask you questions about your:  Alcohol use.  Tobacco use.  Drug use.  Emotional well-being.  Home and relationship well-being.  Sexual activity.  Eating habits.  History of falls.  Memory and ability to understand (cognition).  Work and work Statistician. Screening  You may have the following tests or measurements:  Height, weight, and BMI.  Blood pressure.  Lipid and cholesterol levels. These may be checked every 5 years, or more frequently if you are over 6 years old.  Skin check.  Lung cancer screening. You may have this screening every year starting at age 30 if you have a 30-pack-year history of smoking and currently smoke or have quit within the past 15 years.  Fecal occult blood test (FOBT) of the stool. You may have this test every year starting at age 36.  Flexible sigmoidoscopy or colonoscopy. You may have a sigmoidoscopy every 5 years or a colonoscopy every 10 years starting at age 86.  Prostate cancer screening. Recommendations will vary depending on your family history and other risks.  Hepatitis C blood test.  Hepatitis B blood test.  Sexually transmitted disease (STD) testing.  Diabetes screening. This is done by checking your blood sugar (glucose) after you have not eaten for a while (fasting). You may have this done every 1-3 years.  Abdominal aortic aneurysm (AAA) screening. You may need this if you are a current or former smoker.  Osteoporosis. You may be screened starting at age 18 if you are at high risk. Talk with your health care provider about your test results, treatment options,  and if necessary, the need for more tests. Vaccines  Your health care provider may recommend certain vaccines, such as:  Influenza vaccine. This is recommended every year.  Tetanus, diphtheria, and acellular pertussis (Tdap, Td) vaccine. You may need a Td booster every 10 years.  Zoster vaccine.  You may need this after age 8.  Pneumococcal 13-valent conjugate (PCV13) vaccine. One dose is recommended after age 72.  Pneumococcal polysaccharide (PPSV23) vaccine. One dose is recommended after age 4. Talk to your health care provider about which screenings and vaccines you need and how often you need them. This information is not intended to replace advice given to you by your health care provider. Make sure you discuss any questions you have with your health care provider. Document Released: 12/18/2015 Document Revised: 08/10/2016 Document Reviewed: 09/22/2015 Elsevier Interactive Patient Education  2017 [place] Prevention in the Home Falls can cause injuries. They can happen to people of all ages. There are many things you can do to make your home safe and to help prevent falls. What can I do on the outside of my home?  Regularly fix the edges of walkways and driveways and fix any cracks.  Remove anything that might make you trip as you walk through a door, such as a raised step or threshold.  Trim any bushes or trees on the path to your home.  Use bright outdoor lighting.  Clear any walking paths of anything that might make someone trip, such as rocks or tools.  Regularly check to see if handrails are loose or broken. Make sure that both sides of any steps have handrails.  Any raised decks and porches should have guardrails on the edges.  Have any leaves, snow, or ice cleared regularly.  Use sand or salt on walking paths during winter.  Clean up any spills in your garage right away. This includes oil or grease spills. What can I do in the bathroom?  Use night lights.  Install grab bars by the toilet and in the tub and shower. Do not use towel bars as grab bars.  Use non-skid mats or decals in the tub or shower.  If you need to sit down in the shower, use a plastic, non-slip stool.  Keep the floor dry. Clean up any water that spills on the floor as soon  as it happens.  Remove soap buildup in the tub or shower regularly.  Attach bath mats securely with double-sided non-slip rug tape.  Do not have throw rugs and other things on the floor that can make you trip. What can I do in the bedroom?  Use night lights.  Make sure that you have a light by your bed that is easy to reach.  Do not use any sheets or blankets that are too big for your bed. They should not hang down onto the floor.  Have a firm chair that has side arms. You can use this for support while you get dressed.  Do not have throw rugs and other things on the floor that can make you trip. What can I do in the kitchen?  Clean up any spills right away.  Avoid walking on wet floors.  Keep items that you use a lot in easy-to-reach places.  If you need to reach something above you, use a strong step stool that has a grab bar.  Keep electrical cords out of the way.  Do not use floor polish or wax that makes floors slippery. If  you must use wax, use non-skid floor wax.  Do not have throw rugs and other things on the floor that can make you trip. What can I do with my stairs?  Do not leave any items on the stairs.  Make sure that there are handrails on both sides of the stairs and use them. Fix handrails that are broken or loose. Make sure that handrails are as long as the stairways.  Check any carpeting to make sure that it is firmly attached to the stairs. Fix any carpet that is loose or worn.  Avoid having throw rugs at the top or bottom of the stairs. If you do have throw rugs, attach them to the floor with carpet tape.  Make sure that you have a light switch at the top of the stairs and the bottom of the stairs. If you do not have them, ask someone to add them for you. What else can I do to help prevent falls?  Wear shoes that:  Do not have high heels.  Have rubber bottoms.  Are comfortable and fit you well.  Are closed at the toe. Do not wear sandals.  If  you use a stepladder:  Make sure that it is fully opened. Do not climb a closed stepladder.  Make sure that both sides of the stepladder are locked into place.  Ask someone to hold it for you, if possible.  Clearly mark and make sure that you can see:  Any grab bars or handrails.  First and last steps.  Where the edge of each step is.  Use tools that help you move around (mobility aids) if they are needed. These include:  Canes.  Walkers.  Scooters.  Crutches.  Turn on the lights when you go into a dark area. Replace any light bulbs as soon as they burn out.  Set up your furniture so you have a clear path. Avoid moving your furniture around.  If any of your floors are uneven, fix them.  If there are any pets around you, be aware of where they are.  Review your medicines with your doctor. Some medicines can make you feel dizzy. This can increase your chance of falling. Ask your doctor what other things that you can do to help prevent falls. This information is not intended to replace advice given to you by your health care provider. Make sure you discuss any questions you have with your health care provider. Document Released: 09/17/2009 Document Revised: 04/28/2016 Document Reviewed: 12/26/2014 Elsevier Interactive Patient Education  2017 [employer].

## 2020-02-10 NOTE — Assessment & Plan Note (Signed)
Chronic, stable on last check in 2019; will recheck lipids today.  Continue current statin therapy for now.  CMP also checked.

## 2020-02-10 NOTE — Progress Notes (Signed)
BP 118/67 (BP Location: Left Arm, Patient Position: Sitting, Cuff Size: Normal)   Pulse (!) 59   Temp 98.1 F (36.7 C) (Oral)   SpO2 97%    Subjective:    Patient ID: Ceasar Lund, male    DOB: 1934/12/06, 85 y.o.   MRN: [id number]  HPI: MERYLE PACIFICO is a 85 y.o. male presenting for follow.  Has been lost to follow up for 2 years.   Chief Complaint  Patient presents with  . Hyperlipidemia  . Hypertension    EAR POPPING Duration: weeks Involved ear(s): left Severity:  moderate ; notices at night mostly Quality:  popping Fever: no Otorrhea: no Upper respiratory infection symptoms: no Pruritus: no Hearing loss: yes Water immersion no Using Q-tips: no Recurrent otitis media: no Status: worse Treatments attempted: none   HYPERTENSION / HYPERLIPIDEMIA Satisfied with current treatment? yes Duration of hypertension: chronic BP monitoring frequency: not checking BP medication side effects: no Past BP meds: benazepril/HCTZ Duration of hyperlipidemia: chronic Cholesterol medication side effects: no Cholesterol supplements: none Past cholesterol medications: lovastatin (mevacor) Medication compliance: excellent compliance Aspirin: no Recent stressors: no Recurrent headaches: no Visual changes: no Palpitations: no Dyspnea: no Chest pain: no Lower extremity edema: no Dizzy/lightheaded: no   Patient is also reporting constipation for the past 5 days. He reports he takes OTC colace and senna daily.  He reports he drinks 3 6oz glasses of tea per day but no other liquids.  Reports he very seldom drinks water.  Denies nausea, vomiting, or abdominal pain.  No Known Allergies  Outpatient Encounter Medications as of 02/10/2020  Medication Sig  . benazepril (LOTENSIN) 40 MG tablet Take 1 tablet (40 mg total) by mouth daily. Please call for an appointment for more refills  . docusate sodium (COLACE) 50 MG capsule Take 50 mg by mouth daily.   . hydrochlorothiazide  (HYDRODIURIL) 12.5 MG tablet Take 1 tablet (12.5 mg total) by mouth daily. Please call for an appointment for more refills  . lovastatin (MEVACOR) 40 MG tablet Take 1 tablet (40 mg total) by mouth at bedtime. Please call for an appointment for more refills  . pantoprazole (PROTONIX) 40 MG tablet Take 1 tablet (40 mg total) by mouth daily. Please call for an appointment for more refills  . senna (SENOKOT) 8.6 MG TABS tablet Take 1 tablet by mouth daily.  . [DISCONTINUED] benazepril (LOTENSIN) 40 MG tablet Take 1 tablet (40 mg total) by mouth daily. Please call for an appointment for more refills  . [DISCONTINUED] hydrochlorothiazide (HYDRODIURIL) 12.5 MG tablet Take 1 tablet (12.5 mg total) by mouth daily. Please call for an appointment for more refills  . [DISCONTINUED] lovastatin (MEVACOR) 40 MG tablet Take 1 tablet (40 mg total) by mouth at bedtime. Please call for an appointment for more refills  . [DISCONTINUED] pantoprazole (PROTONIX) 40 MG tablet Take 1 tablet (40 mg total) by mouth daily. Please call for an appointment for more refills  . glycerin adult 2 g suppository Place 1 suppository rectally as needed for constipation.  . polyethylene glycol powder (GLYCOLAX/MIRALAX) 17 GM/SCOOP powder Take 17 g by mouth daily.   No facility-administered encounter medications on file as of 02/10/2020.   Patient Active Problem List   Diagnosis Date Noted  . Constipation 02/10/2020  . CKD (chronic kidney disease) stage 3, GFR 30-59 ml/min 06/25/2018  . Advanced care planning/counseling discussion 12/21/2017  . Esophageal reflux 12/21/2017  . Impacted cerumen of left ear 09/12/2017  . Palpitations 10/31/2016  .  Essential hypertension 03/07/2016  . Hyperlipidemia 03/07/2016  . Eye cancer [facility])    Past Medical History:  Diagnosis Date  . Arthritis   . Chronic kidney disease   . Eye cancer ([place])   . Eye cancer ([place])   . Hyperlipidemia   . Prostate cancer ([place])   . Vertigo, benign positional     Relevant past medical, surgical, family and social history reviewed and updated as indicated. Interim medical history since our last visit reviewed.  Review of Systems  Constitutional: Negative.  Negative for activity change, appetite change, fatigue and fever.  HENT: Positive for ear pain and hearing loss. Negative for congestion, ear discharge, nosebleeds, postnasal drip, rhinorrhea, sinus pain, sneezing, sore throat, tinnitus and trouble swallowing.   Eyes: Negative.  Negative for visual disturbance.  Respiratory: Negative.  Negative for cough, chest tightness, shortness of breath and wheezing.   Cardiovascular: Negative.  Negative for chest pain, palpitations and leg swelling.  Gastrointestinal: Positive for constipation. Negative for abdominal distention, abdominal pain, blood in stool, diarrhea, nausea and vomiting.  Genitourinary: Negative.   Musculoskeletal: Negative.  Negative for arthralgias, joint swelling and myalgias.  Skin: Negative.  Negative for rash and wound.  Neurological: Negative.  Negative for dizziness, weakness, light-headedness and headaches.  Psychiatric/Behavioral: Negative.  Negative for confusion and sleep disturbance. The patient is not nervous/anxious.     Per HPI unless specifically indicated above    Objective:    BP 118/67 (BP Location: Left Arm, Patient Position: Sitting, Cuff Size: Normal)   Pulse (!) 59   Temp 98.1 F (36.7 C) (Oral)   SpO2 97%   Wt Readings from Last 3 Encounters:  02/10/20 183 lb (83 kg)  06/21/18 186 lb (84.4 kg)  04/03/18 187 lb (84.8 kg)    Physical Exam Vitals and nursing note reviewed.  Constitutional:      General: He is not in acute distress.    Appearance: Normal appearance. He is not toxic-appearing.  HENT:     Head: Normocephalic and atraumatic.     Right Ear: Tympanic membrane, ear canal and external ear normal. There is no impacted cerumen.     Left Ear: External ear normal. There is impacted cerumen.   Eyes:     General: Lids are normal. No scleral icterus.       Right eye: No discharge.     Extraocular Movements:     Right eye: Normal extraocular motion and no nystagmus.     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     Pulses: Normal pulses.  Pulmonary:     Effort: Pulmonary effort is normal. No respiratory distress.     Breath sounds: No wheezing or rhonchi.  Abdominal:     General: Abdomen is flat. Bowel sounds are normal. There is no distension.  Musculoskeletal:        General: Normal range of motion.     Cervical back: Normal range of motion. No rigidity.  Skin:    General: Skin is warm and dry.     Coloration: Skin is not pale.  Neurological:     General: No focal deficit present.     Mental Status: He is alert and oriented to person, place, and time.     Motor: No weakness.     Gait: Gait normal.  Psychiatric:        Mood and Affect: Mood normal.  Behavior: Behavior normal.        Thought Content: Thought content normal.        Judgment: Judgment normal.       Assessment & Plan:   Problem List Items Addressed This Visit      Cardiovascular and Mediastinum   Essential hypertension    Chronic, stable.  BP well controlled in office today.  Continue benzaepril.  Refills sent to pharmacy.  CBC and CMP checked today, will follow.      Relevant Medications   benazepril (LOTENSIN) 40 MG tablet   hydrochlorothiazide (HYDRODIURIL) 12.5 MG tablet   lovastatin (MEVACOR) 40 MG tablet   Other Relevant Orders   Comprehensive metabolic panel   CBC with Differential/Platelet   Bayer DCA Hb A1c Waived     Digestive   Esophageal reflux    Chronic, stable.  Well controlled on pantoprazole per patient, continue medication.  Refills sent to pharmacy.      Relevant Medications   polyethylene glycol powder (GLYCOLAX/MIRALAX) 17 GM/SCOOP powder   glycerin adult 2 g suppository    pantoprazole (PROTONIX) 40 MG tablet   senna (SENOKOT) 8.6 MG TABS tablet     Nervous and Auditory   Impacted cerumen of left ear    Left ear flushed with about 25% removal of cerumen.  Denied lightheadedness or dizziness after removal.  Patient did not desire further removal today.  Declined referral to ENT.  Stated he would return to clinic in future to have removed.        Genitourinary   CKD (chronic kidney disease) stage 3, GFR 30-59 ml/min - Primary    Chronic, stable.  Follows with Nephrology, last visit Nov. 2020; GFR was stable.  Encouraged to stay hydrated with water and limit tea intake.  Will check CMP and CBC today.      Relevant Orders   Comprehensive metabolic panel   CBC with Differential/Platelet   Bayer DCA Hb A1c Waived     Other   Hyperlipidemia    Chronic, stable on last check in 2019; will recheck lipids today.  Continue current statin therapy for now.  CMP also checked.      Relevant Medications   benazepril (LOTENSIN) 40 MG tablet   hydrochlorothiazide (HYDRODIURIL) 12.5 MG tablet   lovastatin (MEVACOR) 40 MG tablet   Other Relevant Orders   Lipid Panel w/o Chol/HDL Ratio   Comprehensive metabolic panel   Bayer DCA Hb A1c Waived   Constipation    Chronic, ongoing.  Already taking Colace and senna.  Will start on Miralax daily with prn glycerin suppositories.  Discussed bowel hygiene including increasing water intake to at least match tea intake and using stool under feet when attempting BM.      Relevant Medications   polyethylene glycol powder (GLYCOLAX/MIRALAX) 17 GM/SCOOP powder   glycerin adult 2 g suppository    Other Visit Diagnoses    Screening for thyroid disorder       Relevant Orders   TSH       Follow up plan: Return in about 6 months (around 08/12/2020) for HTN/HLD follow up.

## 2020-02-10 NOTE — Progress Notes (Signed)
Subjective:   Gerald Barry is a 85 y.o. male who presents for Medicare Annual/Subsequent preventive examination.  Review of Systems:   Cardiac Risk Factors include: advanced age (>75men, >25 women);dyslipidemia;hypertension;male gender     Objective:    Vitals: BP 118/67 (BP Location: Left Arm, Patient Position: Sitting, Cuff Size: Normal)   Pulse (!) 59   Temp 98.1 F (36.7 C) (Temporal)   Resp 15   Ht 5\' 6"  (1.676 m)   Wt 183 lb (83 kg)   SpO2 97%   BMI 29.54 kg/m   Body mass index is 29.54 kg/m.  Advanced Directives 02/10/2020 02/10/2020 05/06/2015  Does Patient Have a Medical Advance Directive? No Yes No    Tobacco Social History   Tobacco Use  Smoking Status Never Smoker  Smokeless Tobacco Current User  . Types: Chew     Ready to quit: Not Answered Counseling given: Not Answered   Clinical Intake:  Pre-visit preparation completed: Yes  Pain : No/denies pain     Nutritional Status: BMI 25 -29 Overweight Nutritional Risks: None Diabetes: No  How often do you need to have someone help you when you read instructions, pamphlets, or other written materials from your doctor or pharmacy?: 1 - Never  Interpreter Needed?: No  Information entered by :: Alexiah Koroma,LPN  Past Medical History:  Diagnosis Date  . Arthritis   . Chronic kidney disease   . Eye cancer ([place])   . Eye cancer ([place])   . Hyperlipidemia   . Prostate cancer ([place])   . Vertigo, benign positional    Past Surgical History:  Procedure Laterality Date  . EYE SURGERY     cataract  . JOINT REPLACEMENT Left    knee  . SKIN GRAFT Left    hand   Family History  Problem Relation Age of Onset  . Cancer Mother    Social History   Socioeconomic History  . Marital status: Married    Spouse name: Not on file  . Number of children: Not on file  . Years of education: Not on file  . Highest education level: Not on file  Occupational History  . Occupation: retired  Tobacco Use  .  Smoking status: Never Smoker  . Smokeless tobacco: Current User    Types: Chew  Substance and Sexual Activity  . Alcohol use: No  . Drug use: No  . Sexual activity: Not on file  Other Topics Concern  . Not on file  Social History Narrative  . Not on file   Social Determinants of Health   Financial Resource Strain:   . Difficulty of Paying Living Expenses: Not on file  Food Insecurity:   . Worried About Charity fundraiser in the Last Year: Not on file  . Ran Out of Food in the Last Year: Not on file  Transportation Needs:   . Lack of Transportation (Medical): Not on file  . Lack of Transportation (Non-Medical): Not on file  Physical Activity:   . Days of Exercise per Week: Not on file  . Minutes of Exercise per Session: Not on file  Stress:   . Feeling of Stress : Not on file  Social Connections:   . Frequency of Communication with Friends and Family: Not on file  . Frequency of Social Gatherings with Friends and Family: Not on file  . Attends Religious Services: Not on file  . Active Member of Clubs or Organizations: Not on file  . Attends Club or  Organization Meetings: Not on file  . Marital Status: Not on file    Outpatient Encounter Medications as of 02/10/2020  Medication Sig  . benazepril (LOTENSIN) 40 MG tablet Take 1 tablet (40 mg total) by mouth daily. Please call for an appointment for more refills  . docusate sodium (COLACE) 50 MG capsule Take 50 mg by mouth daily.   . hydrochlorothiazide (HYDRODIURIL) 12.5 MG tablet Take 1 tablet (12.5 mg total) by mouth daily. Please call for an appointment for more refills  . lovastatin (MEVACOR) 40 MG tablet Take 1 tablet (40 mg total) by mouth at bedtime. Please call for an appointment for more refills  . pantoprazole (PROTONIX) 40 MG tablet Take 1 tablet (40 mg total) by mouth daily. Please call for an appointment for more refills   No facility-administered encounter medications on file as of 02/10/2020.    Activities of  Daily Living In your present state of health, do you have any difficulty performing the following activities: 02/10/2020  Hearing? Y  Comment no hearing aids, left ear issues  Vision? Y  Comment eyeglasses. [facility]  Difficulty concentrating or making decisions? N  Walking or climbing stairs? Y  Dressing or bathing? N  Doing errands, shopping? N  Preparing Food and eating ? N  Using the Toilet? N  In the past six months, have you accidently leaked urine? N  Do you have problems with loss of bowel control? N  Managing your Medications? N  Managing your Finances? N  Housekeeping or managing your Housekeeping? N  Some recent data might be hidden    Patient Care Team: Guadalupe Maple, MD as PCP - General (Family Medicine) Guadalupe Maple, MD as PCP - Family Medicine (Family Medicine) Clyde Canterbury, MD as Referring Physician (Otolaryngology) Anthonette Legato, MD (Nephrology)   Assessment:   This is a routine wellness examination for Gerald Barry.  Exercise Activities and Dietary recommendations Current Exercise Habits: The patient does not participate in regular exercise at present, Exercise limited by: None identified  Goals Addressed   None     Fall Risk: Fall Risk  02/10/2020 06/21/2018 04/03/2018 12/21/2017 09/12/2017  Falls in the past year? 1 No Yes No No  Number falls in past yr: 0 - 1 - -  Injury with Fall? 0 - Yes - -  Risk Factor Category  - - High Fall Risk - -  Follow up - - Falls evaluation completed - -    FALL RISK PREVENTION PERTAINING TO THE HOME:  Any stairs in or around the home? No  If so, are there any without handrails? No   Home free of loose throw rugs in walkways, pet beds, electrical cords, etc? Yes  Adequate lighting in your home to reduce risk of falls? Yes   ASSISTIVE DEVICES UTILIZED TO PREVENT FALLS:  Life alert? No  Use of a cane, walker or w/c? No  Grab bars in the bathroom? No  Shower chair or bench in shower? No  Elevated toilet  seat or a handicapped toilet? No   TIMED UP AND GO:  Was the test performed? Yes .  Length of time to ambulate 10 feet: 10 sec.   GAIT:  Appearance of gait: Gait steady and fast without the use of an assistive device.   Education: Fall risk prevention has been discussed.  Intervention(s) required? No  DME/home health order needed?  No   Depression Screen PHQ 2/9 Scores 02/10/2020 12/21/2017 09/12/2017 04/25/2016  PHQ - 2 Score  0 0 0 0    Cognitive Function     6CIT Screen 02/10/2020  What Year? 0 points  What month? 0 points  What time? 0 points  Count back from 20 0 points  Months in reverse 0 points  Repeat phrase 0 points  Total Score 0    Immunization History  Administered Date(s) Administered  . Influenza, High Dose Seasonal PF 10/17/2016, 09/12/2017  . Pneumococcal Conjugate-13 08/04/2014  . Pneumococcal-Unspecified 07/15/1978, 06/04/2002  . Tdap 05/06/2015  . Zoster 01/21/2008    Qualifies for Shingles Vaccine? Yes  Zostavax completed 2009. Due for Shingrix. Education has been provided regarding the importance of this vaccine. Pt has been advised to call insurance company to determine out of pocket expense. Advised may also receive vaccine at local pharmacy or Health Dept. Verbalized acceptance and understanding.  Tdap: up to date   Flu Vaccine: Due for Flu vaccine. Does the patient want to receive this vaccine today?  No . Education has been provided regarding the importance of this vaccine but still declined. Advised may receive this vaccine at local pharmacy or Health Dept. Aware to provide a copy of the vaccination record if obtained from local pharmacy or Health Dept. Verbalized acceptance and understanding.  Pneumococcal Vaccine: up to date .   Covid-19 Vaccine: declined   Screening Tests Health Maintenance  Topic Date Due  . INFLUENZA VACCINE  03/04/2020 (Originally 07/06/2019)  . TETANUS/TDAP  05/05/2025   Cancer Screenings:  Colorectal Screening: no  longer required   Lung Cancer Screening: (Low Dose CT Chest recommended if Age 47-80 years, 30 pack-year currently smoking OR have quit w/in 15years.) does not qualify.     Additional Screening:  Hepatitis C Screening: does not qualify  Vision Screening: Recommended annual ophthalmology exams for early detection of glaucoma and other disorders of the eye. Is the patient up to date with their annual eye exam?  Yes  Who is the provider or what is the name of the office in which the pt attends annual eye exams? [facility]    Dental Screening: Recommended annual dental exams for proper oral hygiene  Community Resource Referral:  CRR required this visit?  No        Plan:  I have personally reviewed and addressed the Medicare Annual Wellness questionnaire and have noted the following in the patient's chart:  A. Medical and social history B. Use of alcohol, tobacco or illicit drugs  C. Current medications and supplements D. Functional ability and status E.  Nutritional status F.  Physical activity G. Advance directives H. List of other physicians I.  Hospitalizations, surgeries, and ER visits in previous 12 months J.  [place] such as hearing and vision if needed, cognitive and depression L. Referrals and appointments   In addition, I have reviewed and discussed with patient certain preventive protocols, quality metrics, and best practice recommendations. A written personalized care plan for preventive services as well as general preventive health recommendations were provided to patient.   Signed,   Bevelyn Ngo, LPN  075[facility] Nurse Health Advisor   Nurse Notes: none

## 2020-02-10 NOTE — Assessment & Plan Note (Signed)
Chronic, stable.  Well controlled on pantoprazole per patient, continue medication.  Refills sent to pharmacy.

## 2020-02-10 NOTE — Assessment & Plan Note (Signed)
Chronic, ongoing.  Already taking Colace and senna.  Will start on Miralax daily with prn glycerin suppositories.  Discussed bowel hygiene including increasing water intake to at least match tea intake and using stool under feet when attempting BM.

## 2020-02-11 LAB — CBC WITH DIFFERENTIAL/PLATELET
Basophils Absolute: 0.1 10*3/uL (ref 0.0–0.2)
Basos: 1 %
EOS (ABSOLUTE): 0.1 10*3/uL (ref 0.0–0.4)
Eos: 2 %
Hematocrit: 44.1 % (ref 37.5–51.0)
Hemoglobin: 14.6 g/dL (ref 13.0–17.7)
Immature Grans (Abs): 0.1 10*3/uL (ref 0.0–0.1)
Immature Granulocytes: 1 %
Lymphocytes Absolute: 3.7 10*3/uL — ABNORMAL HIGH (ref 0.7–3.1)
Lymphs: 45 %
MCH: 30.4 pg (ref 26.6–33.0)
MCHC: 33.1 g/dL (ref 31.5–35.7)
MCV: 92 fL (ref 79–97)
Monocytes Absolute: 0.5 10*3/uL (ref 0.1–0.9)
Monocytes: 6 %
Neutrophils Absolute: 3.9 10*3/uL (ref 1.4–7.0)
Neutrophils: 45 %
Platelets: 182 10*3/uL (ref 150–450)
RBC: 4.81 x10E6/uL (ref 4.14–5.80)
RDW: 13.4 % (ref 11.6–15.4)
WBC: 8.4 10*3/uL (ref 3.4–10.8)

## 2020-02-11 LAB — COMPREHENSIVE METABOLIC PANEL
ALT: 10 IU/L (ref 0–44)
AST: 19 IU/L (ref 0–40)
Albumin/Globulin Ratio: 1.7 (ref 1.2–2.2)
Albumin: 4.4 g/dL (ref 3.6–4.6)
Alkaline Phosphatase: 85 IU/L (ref 39–117)
BUN/Creatinine Ratio: 13 (ref 10–24)
BUN: 25 mg/dL (ref 8–27)
Bilirubin Total: 0.5 mg/dL (ref 0.0–1.2)
CO2: 25 mmol/L (ref 20–29)
Calcium: 9.9 mg/dL (ref 8.6–10.2)
Chloride: 101 mmol/L (ref 96–106)
Creatinine, Ser: 1.89 mg/dL — ABNORMAL HIGH (ref 0.76–1.27)
GFR calc Af Amer: 37 mL/min/{1.73_m2} — ABNORMAL LOW (ref >59)
GFR calc non Af Amer: 32 mL/min/{1.73_m2} — ABNORMAL LOW (ref >59)
Globulin, Total: 2.6 g/dL (ref 1.5–4.5)
Glucose: 91 mg/dL (ref 65–99)
Potassium: 4 mmol/L (ref 3.5–5.2)
Sodium: 140 mmol/L (ref 134–144)
Total Protein: 7 g/dL (ref 6.0–8.5)

## 2020-02-11 LAB — LIPID PANEL W/O CHOL/HDL RATIO
Cholesterol, Total: 135 mg/dL (ref 100–199)
HDL: 51 mg/dL (ref >39)
LDL Chol Calc (NIH): 71 mg/dL (ref 0–99)
Triglycerides: 64 mg/dL (ref 0–149)
VLDL Cholesterol Cal: 13 mg/dL (ref 5–40)

## 2020-02-11 LAB — TSH: TSH: 1.9 u[IU]/mL (ref 0.450–4.500)

## 2020-03-09 DIAGNOSIS — N2581 Secondary hyperparathyroidism of renal origin: Secondary | ICD-10-CM | POA: Diagnosis not present

## 2020-03-09 DIAGNOSIS — N1832 Chronic kidney disease, stage 3b: Secondary | ICD-10-CM | POA: Diagnosis not present

## 2020-03-09 DIAGNOSIS — I10 Essential (primary) hypertension: Secondary | ICD-10-CM | POA: Diagnosis not present

## 2020-07-02 DIAGNOSIS — D485 Neoplasm of uncertain behavior of skin: Secondary | ICD-10-CM | POA: Diagnosis not present

## 2020-07-29 DIAGNOSIS — R9089 Other abnormal findings on diagnostic imaging of central nervous system: Secondary | ICD-10-CM | POA: Diagnosis not present

## 2020-07-29 DIAGNOSIS — G9389 Other specified disorders of brain: Secondary | ICD-10-CM | POA: Diagnosis not present

## 2020-07-29 DIAGNOSIS — D485 Neoplasm of uncertain behavior of skin: Secondary | ICD-10-CM | POA: Diagnosis not present

## 2020-08-31 DIAGNOSIS — D485 Neoplasm of uncertain behavior of skin: Secondary | ICD-10-CM | POA: Diagnosis not present

## 2020-09-01 ENCOUNTER — Other Ambulatory Visit: Payer: Self-pay | Admitting: Family Medicine

## 2020-09-01 DIAGNOSIS — E785 Hyperlipidemia, unspecified: Secondary | ICD-10-CM

## 2020-09-01 DIAGNOSIS — I10 Essential (primary) hypertension: Secondary | ICD-10-CM

## 2020-09-01 MED ORDER — BENAZEPRIL HCL 40 MG PO TABS: 40.0000 mg | ORAL_TABLET | Freq: Every day | ORAL | 0 refills | Status: DC

## 2020-09-01 MED ORDER — PANTOPRAZOLE SODIUM 40 MG PO TBEC: 40.0000 mg | DELAYED_RELEASE_TABLET | Freq: Every day | ORAL | 1 refills | Status: DC

## 2020-09-01 MED ORDER — LOVASTATIN 40 MG PO TABS: 40.0000 mg | ORAL_TABLET | Freq: Every day | ORAL | 1 refills | Status: DC

## 2020-09-01 MED ORDER — HYDROCHLOROTHIAZIDE 12.5 MG PO TABS: 12.5000 mg | ORAL_TABLET | Freq: Every day | ORAL | 0 refills | Status: DC

## 2020-09-01 NOTE — Telephone Encounter (Signed)
PT need a refill  hydrochlorothiazide (HYDRODIURIL) 12.5 MG tablet [[id number]]  benazepril (LOTENSIN) 40 MG tablet [[id number]]  lovastatin (MEVACOR) 40 MG tablet [[id number]]  pantoprazole (PROTONIX) 40 MG tablet [[id number]]  [facility] - [place], [place] - [place] [address]  Phone: [phone] Fax: [phone]

## 2020-09-01 NOTE — Telephone Encounter (Signed)
Call to patient- scheduled for follow up appointment- Courtesy RF granted

## 2020-09-08 ENCOUNTER — Ambulatory Visit (INDEPENDENT_AMBULATORY_CARE_PROVIDER_SITE_OTHER): Payer: Medicare Other | Admitting: Nurse Practitioner

## 2020-09-08 ENCOUNTER — Other Ambulatory Visit: Payer: Self-pay

## 2020-09-08 ENCOUNTER — Encounter: Payer: Self-pay | Admitting: Nurse Practitioner

## 2020-09-08 VITALS — BP 88/52 | HR 49 | Temp 98.4°F | Ht 67.0 in | Wt 167.0 lb

## 2020-09-08 DIAGNOSIS — E785 Hyperlipidemia, unspecified: Secondary | ICD-10-CM | POA: Diagnosis not present

## 2020-09-08 DIAGNOSIS — N1832 Chronic kidney disease, stage 3b: Secondary | ICD-10-CM | POA: Diagnosis not present

## 2020-09-08 DIAGNOSIS — Z23 Encounter for immunization: Secondary | ICD-10-CM | POA: Diagnosis not present

## 2020-09-08 DIAGNOSIS — I952 Hypotension due to drugs: Secondary | ICD-10-CM | POA: Diagnosis not present

## 2020-09-08 NOTE — Progress Notes (Addendum)
BP (!) 88/52 (BP Location: Left Arm, Cuff Size: Normal)   Pulse (!) 49   Temp 98.4 F (36.9 C) (Oral)   Ht 5\' 7"  (1.702 m)   Wt 167 lb (75.8 kg)   SpO2 98%   BMI 26.16 kg/m    Subjective:    Patient ID: Gerald Barry, male    DOB: 09-Jul-1935, 85 y.o.   MRN: [id number]  HPI: Gerald Barry is a 85 y.o. male presenting for follow up.  Chief Complaint  Patient presents with  . Flu Vaccine  . Hypertension    follow up   . Hyperlipidemia  . Gastroesophageal Reflux   HYPERTENSION / [place] Satisfied with current treatment? yes Duration of hypertension: chronic BP monitoring frequency: not checking BP medication side effects: no Past BP meds: hctz 12.5, benazepril 40 mg Duration of hyperlipidemia: chronic Cholesterol medication side effects: no Cholesterol supplements: none Past cholesterol medications:  lovastatin 40 mg Medication compliance: excellent compliance Aspirin: no Recent stressors: no Recurrent headaches: no Visual changes: no Palpitations: no Dyspnea: no Chest pain: no Lower extremity edema: no Dizzy/lightheaded: no    No Known Allergies  Outpatient Encounter Medications as of 09/08/2020  Medication Sig  . benazepril (LOTENSIN) 40 MG tablet Take 1 tablet (40 mg total) by mouth daily.  Marland Kitchen erythromycin ophthalmic ointment SMARTSIG:In Eye(s)  . lovastatin (MEVACOR) 40 MG tablet Take 1 tablet (40 mg total) by mouth at bedtime.  . pantoprazole (PROTONIX) 40 MG tablet Take 1 tablet (40 mg total) by mouth daily.  . [DISCONTINUED] hydrochlorothiazide (HYDRODIURIL) 12.5 MG tablet Take 1 tablet (12.5 mg total) by mouth daily.  Marland Kitchen docusate sodium (COLACE) 50 MG capsule Take 50 mg by mouth daily.  (Patient not taking: Reported on 09/08/2020)  . glycerin adult 2 g suppository Place 1 suppository rectally as needed for constipation. (Patient not taking: Reported on 09/08/2020)  . polyethylene glycol powder (GLYCOLAX/MIRALAX) 17 GM/SCOOP powder Take 17 g by  mouth daily. (Patient not taking: Reported on 09/08/2020)  . senna (SENOKOT) 8.6 MG TABS tablet Take 1 tablet by mouth daily. (Patient not taking: Reported on 09/08/2020)   No facility-administered encounter medications on file as of 09/08/2020.   Patient Active Problem List   Diagnosis Date Noted  . Hypotension due to drugs 09/08/2020  . Constipation 02/10/2020  . CKD (chronic kidney disease) stage 3, GFR 30-59 ml/min (HCC) 06/25/2018  . Advanced care planning/counseling discussion 12/21/2017  . Esophageal reflux 12/21/2017  . Impacted cerumen of left ear 09/12/2017  . Palpitations 10/31/2016  . Essential hypertension 03/07/2016  . Hyperlipidemia 03/07/2016  . Eye cancer [facility])    Past Medical History:  Diagnosis Date  . Arthritis   . Chronic kidney disease   . Eye cancer ([place])   . Eye cancer ([place])   . Hyperlipidemia   . Prostate cancer ([place])   . Vertigo, benign positional    Relevant past medical, surgical, family and social history reviewed and updated as indicated. Interim medical history since our last visit reviewed.  Review of Systems  Constitutional: Negative.  Negative for activity change, appetite change, diaphoresis, fatigue and fever.  Eyes: Negative.  Negative for visual disturbance.  Respiratory: Negative.  Negative for cough, shortness of breath and wheezing.   Cardiovascular: Negative.  Negative for chest pain, palpitations and leg swelling.  Gastrointestinal: Negative.  Negative for diarrhea, nausea and vomiting.  Endocrine: Negative.   Musculoskeletal: Negative.   Skin: Negative.  Negative for rash.  Neurological: Negative.  Negative for  dizziness, weakness, light-headedness and headaches.  Psychiatric/Behavioral: Negative.  Negative for confusion, self-injury and sleep disturbance.    Per HPI unless specifically indicated above     Objective:    BP (!) 88/52 (BP Location: Left Arm, Cuff Size: Normal)   Pulse (!) 49   Temp 98.4 F (36.9 C) (Oral)   Ht  5\' 7"  (1.702 m)   Wt 167 lb (75.8 kg)   SpO2 98%   BMI 26.16 kg/m   Wt Readings from Last 3 Encounters:  09/08/20 167 lb (75.8 kg)  02/10/20 183 lb (83 kg)  06/21/18 186 lb (84.4 kg)    Physical Exam Vitals and nursing note reviewed.  Constitutional:      General: He is not in acute distress.    Appearance: Normal appearance. He is normal weight.  Eyes:     General: No scleral icterus.    Extraocular Movements: Extraocular movements intact.  Cardiovascular:     Rate and Rhythm: Normal rate.     Heart sounds: Normal heart sounds. No murmur heard.   Pulmonary:     Effort: Pulmonary effort is normal. No respiratory distress.     Breath sounds: Normal breath sounds. No wheezing or rhonchi.  Abdominal:     General: Abdomen is flat. Bowel sounds are normal. There is no distension.  Musculoskeletal:        General: Normal range of motion.     Right lower leg: No edema.     Left lower leg: No edema.  Skin:    General: Skin is warm and dry.     Coloration: Skin is not jaundiced or pale.  Neurological:     General: No focal deficit present.     Mental Status: He is alert and oriented to person, place, and time.     Motor: No weakness.     Gait: Gait normal.  Psychiatric:        Mood and Affect: Mood normal.        Behavior: Behavior normal.        Thought Content: Thought content normal.        Judgment: Judgment normal.       Assessment & Plan:   Problem List Items Addressed This Visit      Cardiovascular and Mediastinum   Hypotension due to drugs - Primary    Acute, ongoing.  Recheck BP remains low.  Patient asymptomatic.  Likely due to dehydration secondary to diuretic. Will stop HCTZ and check CMP today.  Patient adamantly refusing to go to ER for further work up.  Encouraged patient to drink plenty of fluids.  Encouraged patient to check BP at home and keep a log to bring to next appointment.  Follow up in 2 days.         Genitourinary   CKD (chronic kidney  disease) stage 3, GFR 30-59 ml/min (HCC)    Chronic, stable.  Continue collaboration with Nephrology.  CMP checked today.  Encouraged increased water intake and at least match amount of tea intake with water intake per day.        Other   Hyperlipidemia    Chronic, previously stable.  Patient reports he stopped his Lovastatin and does not think he needs it any longer.         Other Visit Diagnoses    Need for immunization against influenza       Relevant Orders   Flu Vaccine QUAD High Dose(Fluad) (Completed)   Comprehensive metabolic panel (Completed)  Follow up plan: Return in about 3 days (around 09/11/2020) for BP f/u okay to put on my schedule at 10:00 am on Friday.

## 2020-09-08 NOTE — Assessment & Plan Note (Addendum)
Acute, ongoing.  Recheck BP remains low.  Patient asymptomatic.  Likely due to dehydration secondary to diuretic. Will stop HCTZ and check CMP today.  Patient adamantly refusing to go to ER for further work up.  Encouraged patient to drink plenty of fluids.  Encouraged patient to check BP at home and keep a log to bring to next appointment.  Follow up in 2 days.

## 2020-09-08 NOTE — Patient Instructions (Addendum)
Gerald Barry, Be sure to STOP your hydrochlorothiazide until we see you again.  We are checking your dehydration status today.  Be sure to drink plenty of water today.  We will see you at 10:00 am on Friday.    Dehydration, Adult Dehydration is condition in which there is not enough water or other fluids in the body. This happens when a person loses more fluids than he or she takes in. Important body parts cannot work right without the right amount of fluids. Any loss of fluids from the body can cause dehydration. Dehydration can be mild, worse, or very bad. It should be treated right away to keep it from getting very bad. What are the causes? This condition may be caused by:  Conditions that cause loss of water or other fluids, such as: ? Watery poop (diarrhea). ? Vomiting. ? Sweating a lot. ? Peeing (urinating) a lot.  Not drinking enough fluids, especially when you: ? Are ill. ? Are doing things that take a lot of energy to do.  Other illnesses and conditions, such as fever or infection.  Certain medicines, such as medicines that take extra fluid out of the body (diuretics).  Lack of safe drinking water.  Not being able to get enough water and food. What increases the risk? The following factors may make you more likely to develop this condition:  Having a long-term (chronic) illness that has not been treated the right way, such as: ? Diabetes. ? Heart disease. ? Kidney disease.  Being 64 years of age or older.  Having a disability.  Living in a place that is high above the ground or sea (high in altitude). The thinner, dried air causes more fluid loss.  Doing exercises that put stress on your body for a long time. What are the signs or symptoms? Symptoms of dehydration depend on how bad it is. Mild or worse dehydration  Thirst.  Dry lips or dry mouth.  Feeling dizzy or light-headed, especially when you stand up from sitting.  Muscle cramps.  Your body  making: ? Dark pee (urine). Pee may be the color of tea. ? Less pee than normal. ? Less tears than normal.  Headache. Very bad dehydration  Changes in skin. Skin may: ? Be cold to the touch (clammy). ? Be blotchy or pale. ? Not go back to normal right after you lightly pinch it and let it go.  Little or no tears, pee, or sweat.  Changes in vital signs, such as: ? Fast breathing. ? Low blood pressure. ? Weak pulse. ? Pulse that is more than 100 beats a minute when you are sitting still.  Other changes, such as: ? Feeling very thirsty. ? Eyes that look hollow (sunken). ? Cold hands and feet. ? Being mixed up (confused). ? Being very tired (lethargic) or having trouble waking from sleep. ? Short-term weight loss. ? Loss of consciousness. How is this treated? Treatment for this condition depends on how bad it is. Treatment should start right away. Do not wait until your condition gets very bad. Very bad dehydration is an emergency. You will need to go to a hospital.  Mild or worse dehydration can be treated at home. You may be asked to: ? Drink more fluids. ? Drink an oral rehydration solution (ORS). This drink helps get the right amounts of fluids and salts and minerals in the blood (electrolytes).  Very bad dehydration can be treated: ? With fluids through an IV tube. ? By getting  normal levels of salts and minerals in your blood. This is often done by giving salts and minerals through a tube. The tube is passed through your nose and into your stomach. ? By treating the root cause. Follow these instructions at home: Oral rehydration solution If told by your doctor, drink an ORS:  Make an ORS. Use instructions on the package.  Start by drinking small amounts, about  cup (120 mL) every 5-10 minutes.  Slowly drink more until you have had the amount that your doctor said to have. Eating and drinking         Drink enough clear fluid to keep your pee pale yellow. If  you were told to drink an ORS, finish the ORS first. Then, start slowly drinking other clear fluids. Drink fluids such as: ? Water. Do not drink only water. Doing that can make the salt (sodium) level in your body get too low. ? Water from ice chips you suck on. ? Fruit juice that you have added water to (diluted). ? Low-calorie sports drinks.  Eat foods that have the right amounts of salts and minerals, such as: ? Bananas. ? Oranges. ? Potatoes. ? Tomatoes. ? Spinach.  Do not drink alcohol.  Avoid: ? Drinks that have a lot of sugar. These include:  High-calorie sports drinks.  Fruit juice that you did not add water to.  Soda.  Caffeine. ? Foods that are greasy or have a lot of fat or sugar. General instructions  Take over-the-counter and prescription medicines only as told by your doctor.  Do not take salt tablets. Doing that can make the salt level in your body get too high.  Return to your normal activities as told by your doctor. Ask your doctor what activities are safe for you.  Keep all follow-up visits as told by your doctor. This is important. Contact a doctor if:  You have pain in your belly (abdomen) and the pain: ? Gets worse. ? Stays in one place.  You have a rash.  You have a stiff neck.  You get angry or annoyed (irritable) more easily than normal.  You are more tired or have a harder time waking than normal.  You feel: ? Weak or dizzy. ? Very thirsty. Get help right away if you have:  Any symptoms of very bad dehydration.  Symptoms of vomiting, such as: ? You cannot eat or drink without vomiting. ? Your vomiting gets worse or does not go away. ? Your vomit has blood or green stuff in it.  Symptoms that get worse with treatment.  A fever.  A very bad headache.  Problems with peeing or pooping (having a bowel movement), such as: ? Watery poop that gets worse or does not go away. ? Blood in your poop (stool). This may cause poop to look  black and tarry. ? Not peeing in 6-8 hours. ? Peeing only a small amount of very dark pee in 6-8 hours.  Trouble breathing. These symptoms may be an emergency. Do not wait to see if the symptoms will go away. Get medical help right away. Call your local emergency services (911 in the [place] Do not drive yourself to the hospital. Summary  Dehydration is a condition in which there is not enough water or other fluids in the body. This happens when a person loses more fluids than he or she takes in.  Treatment for this condition depends on how bad it is. Treatment should be started right away. Do  not wait until your condition gets very bad.  Drink enough clear fluid to keep your pee pale yellow. If you were told to drink an oral rehydration solution (ORS), finish the ORS first. Then, start slowly drinking other clear fluids.  Take over-the-counter and prescription medicines only as told by your doctor.  Get help right away if you have any symptoms of very bad dehydration. This information is not intended to replace advice given to you by your health care provider. Make sure you discuss any questions you have with your health care provider. Document Revised: 07/04/2019 Document Reviewed: 07/04/2019 Elsevier Patient Education  [place].

## 2020-09-08 NOTE — Assessment & Plan Note (Signed)
Chronic, stable.  Continue collaboration with Nephrology.  CMP checked today.  Encouraged increased water intake and at least match amount of tea intake with water intake per day.

## 2020-09-08 NOTE — Assessment & Plan Note (Signed)
Chronic, previously stable.  Patient reports he stopped his Lovastatin and does not think he needs it any longer.

## 2020-09-09 LAB — COMPREHENSIVE METABOLIC PANEL
ALT: 11 IU/L (ref 0–44)
AST: 16 IU/L (ref 0–40)
Albumin/Globulin Ratio: 1.8 (ref 1.2–2.2)
Albumin: 4.4 g/dL (ref 3.6–4.6)
Alkaline Phosphatase: 80 IU/L (ref 44–121)
BUN/Creatinine Ratio: 18 (ref 10–24)
BUN: 33 mg/dL — ABNORMAL HIGH (ref 8–27)
Bilirubin Total: 0.5 mg/dL (ref 0.0–1.2)
CO2: 25 mmol/L (ref 20–29)
Calcium: 10 mg/dL (ref 8.6–10.2)
Chloride: 100 mmol/L (ref 96–106)
Creatinine, Ser: 1.84 mg/dL — ABNORMAL HIGH (ref 0.76–1.27)
GFR calc Af Amer: 39 mL/min/{1.73_m2} — ABNORMAL LOW (ref >59)
GFR calc non Af Amer: 33 mL/min/{1.73_m2} — ABNORMAL LOW (ref >59)
Globulin, Total: 2.5 g/dL (ref 1.5–4.5)
Glucose: 87 mg/dL (ref 65–99)
Potassium: 4 mmol/L (ref 3.5–5.2)
Sodium: 140 mmol/L (ref 134–144)
Total Protein: 6.9 g/dL (ref 6.0–8.5)

## 2020-09-11 ENCOUNTER — Encounter: Payer: Self-pay | Admitting: Nurse Practitioner

## 2020-09-11 ENCOUNTER — Other Ambulatory Visit: Payer: Self-pay

## 2020-09-11 ENCOUNTER — Ambulatory Visit: Payer: Medicare Other | Admitting: Nurse Practitioner

## 2020-09-11 VITALS — BP 92/46 | HR 60 | Temp 98.0°F | Ht 67.0 in | Wt 167.0 lb

## 2020-09-11 DIAGNOSIS — I952 Hypotension due to drugs: Secondary | ICD-10-CM

## 2020-09-11 NOTE — Progress Notes (Signed)
BP (!) 92/46 (BP Location: Left Arm, Cuff Size: Normal)   Pulse 60   Temp 98 F (36.7 C) (Oral)   Ht 5\' 7"  (1.702 m)   Wt 167 lb (75.8 kg)   SpO2 92%   BMI 26.16 kg/m    Subjective:    Patient ID: Gerald Barry, male    DOB: Feb 17, 1935, 85 y.o.   MRN: [id number]  HPI: Gerald Barry is a 85 y.o. male presenting for blood pressure follow up.  Chief Complaint  Patient presents with  . Hypertension    follow up - wanted to know what foods he is supposed to eat    HYPOTENSION Feels about the same.  No chest pain, shortness of breath, or dizziness.  BP running in low 100s/50s at home. Hypertension status: over treated  Satisfied with current treatment? no Duration of hypertension: chronic BP monitoring frequency:  Multiple times daily BP range: 90s-100s/40s-50s BP medication side effects:  yes Medication compliance: excellent compliance Previous BP meds: hctz 12.5, benazepril 40 mg daily Aspirin: no Recurrent headaches: no Visual changes: no Palpitations: no Dyspnea: no Chest pain: no Lower extremity edema: no Dizzy/lightheaded: no  No Known Allergies  Outpatient Encounter Medications as of 09/11/2020  Medication Sig  . erythromycin ophthalmic ointment SMARTSIG:In Eye(s)  . lovastatin (MEVACOR) 40 MG tablet Take 1 tablet (40 mg total) by mouth at bedtime.  . pantoprazole (PROTONIX) 40 MG tablet Take 1 tablet (40 mg total) by mouth daily.  . [DISCONTINUED] benazepril (LOTENSIN) 40 MG tablet Take 1 tablet (40 mg total) by mouth daily.  Marland Kitchen docusate sodium (COLACE) 50 MG capsule Take 50 mg by mouth daily.  (Patient not taking: Reported on 09/08/2020)  . glycerin adult 2 g suppository Place 1 suppository rectally as needed for constipation. (Patient not taking: Reported on 09/08/2020)  . polyethylene glycol powder (GLYCOLAX/MIRALAX) 17 GM/SCOOP powder Take 17 g by mouth daily. (Patient not taking: Reported on 09/08/2020)  . senna (SENOKOT) 8.6 MG TABS tablet Take 1 tablet  by mouth daily. (Patient not taking: Reported on 09/08/2020)   No facility-administered encounter medications on file as of 09/11/2020.   Patient Active Problem List   Diagnosis Date Noted  . Hypotension due to drugs 09/08/2020  . Constipation 02/10/2020  . CKD (chronic kidney disease) stage 3, GFR 30-59 ml/min (HCC) 06/25/2018  . Advanced care planning/counseling discussion 12/21/2017  . Esophageal reflux 12/21/2017  . Impacted cerumen of left ear 09/12/2017  . Palpitations 10/31/2016  . Essential hypertension 03/07/2016  . Hyperlipidemia 03/07/2016  . Eye cancer [facility])    Past Medical History:  Diagnosis Date  . Arthritis   . Chronic kidney disease   . Eye cancer ([place])   . Eye cancer ([place])   . Hyperlipidemia   . Prostate cancer ([place])   . Vertigo, benign positional    Relevant past medical, surgical, family and social history reviewed and updated as indicated. Interim medical history since our last visit reviewed.  Review of Systems  Constitutional: Negative.  Negative for activity change, appetite change, diaphoresis, fatigue and fever.  Eyes: Negative.  Negative for visual disturbance.  Respiratory: Negative.  Negative for cough, shortness of breath and wheezing.   Cardiovascular: Negative.  Negative for chest pain, palpitations and leg swelling.  Gastrointestinal: Negative.  Negative for diarrhea, nausea and vomiting.  Neurological: Negative.  Negative for dizziness, weakness, light-headedness and headaches.  Psychiatric/Behavioral: Negative.  Negative for confusion, self-injury and sleep disturbance.    Per HPI unless specifically  indicated above     Objective:    BP (!) 92/46 (BP Location: Left Arm, Cuff Size: Normal)   Pulse 60   Temp 98 F (36.7 C) (Oral)   Ht 5\' 7"  (1.702 m)   Wt 167 lb (75.8 kg)   SpO2 92%   BMI 26.16 kg/m   Wt Readings from Last 3 Encounters:  09/11/20 167 lb (75.8 kg)  09/08/20 167 lb (75.8 kg)  02/10/20 183 lb (83 kg)    Physical  Exam Vitals and nursing note reviewed.  Constitutional:      General: He is not in acute distress.    Appearance: Normal appearance. He is normal weight.  Eyes:     General: No scleral icterus.    Extraocular Movements: Extraocular movements intact.     Comments: S/p left eye extenuation   Cardiovascular:     Rate and Rhythm: Normal rate.     Heart sounds: Normal heart sounds. No murmur heard.   Pulmonary:     Effort: Pulmonary effort is normal. No respiratory distress.     Breath sounds: Normal breath sounds. No wheezing or rhonchi.  Abdominal:     General: Abdomen is flat. Bowel sounds are normal. There is no distension.  Skin:    General: Skin is warm and dry.     Coloration: Skin is not jaundiced or pale.  Neurological:     General: No focal deficit present.     Mental Status: He is alert and oriented to person, place, and time.     Motor: No weakness.     Gait: Gait normal.  Psychiatric:        Mood and Affect: Mood normal.        Behavior: Behavior normal.        Thought Content: Thought content normal.        Judgment: Judgment normal.     Results for orders placed or performed in visit on 09/08/20  Comprehensive metabolic panel  Result Value Ref Range   Glucose 87 65 - 99 mg/dL   BUN 33 (H) 8 - 27 mg/dL   Creatinine, Ser 1.84 (H) 0.76 - 1.27 mg/dL   GFR calc non Af Amer 33 (L) >59 mL/min/1.73   GFR calc Af Amer 39 (L) >59 mL/min/1.73   BUN/Creatinine Ratio 18 10 - 24   Sodium 140 134 - 144 mmol/L   Potassium 4.0 3.5 - 5.2 mmol/L   Chloride 100 96 - 106 mmol/L   CO2 25 20 - 29 mmol/L   Calcium 10.0 8.6 - 10.2 mg/dL   Total Protein 6.9 6.0 - 8.5 g/dL   Albumin 4.4 3.6 - 4.6 g/dL   Globulin, Total 2.5 1.5 - 4.5 g/dL   Albumin/Globulin Ratio 1.8 1.2 - 2.2   Bilirubin Total 0.5 0.0 - 1.2 mg/dL   Alkaline Phosphatase 80 44 - 121 IU/L   AST 16 0 - 40 IU/L   ALT 11 0 - 44 IU/L      Assessment & Plan:   Problem List Items Addressed This Visit       Cardiovascular and Mediastinum   Hypotension due to drugs - Primary    Acute, ongoing.  Blood pressure remains overtreated.  Reports higher blood pressures at home.  Will stop benazepril and follow-up in 1 week.  With any shortness of breath, chest pain, dizziness patient to go to ER.          Follow up plan: Return in about 1 week (around  09/18/2020) for BP f/u at 10:20 am on 09/17/2020.

## 2020-09-11 NOTE — Assessment & Plan Note (Signed)
Acute, ongoing.  Blood pressure remains overtreated.  Reports higher blood pressures at home.  Will stop benazepril and follow-up in 1 week.  With any shortness of breath, chest pain, dizziness patient to go to ER.

## 2020-09-11 NOTE — Patient Instructions (Signed)
Hypotension As your heart beats, it forces blood through your body. This force is called blood pressure. If you have hypotension, you have low blood pressure. When your blood pressure is too low, you may not get enough blood to your brain or other parts of your body. This may cause you to feel weak, light-headed, have a fast heartbeat, or even pass out (faint). Low blood pressure may be harmless, or it may cause serious problems. What are the causes?  Blood loss.  Not enough water in the body (dehydration).  Heart problems.  Hormone problems.  Pregnancy.  A very bad infection.  Not having enough of certain nutrients.  Very bad allergic reactions.  Certain medicines. What increases the risk?  Age. The risk increases as you get older.  Conditions that affect the heart or the brain and spinal cord (central nervous system).  Taking certain medicines.  Being pregnant. What are the signs or symptoms?  Feeling: ? Weak. ? Light-headed. ? Dizzy. ? Tired (fatigued).  Blurred vision.  Fast heartbeat.  Passing out, in very bad cases. How is this treated?  Changing your diet. This may involve eating more salt (sodium) or drinking more water.  Taking medicines to raise your blood pressure.  Changing how much you take (the dosage) of some of your medicines.  Wearing compression stockings. These stockings help to prevent blood clots and reduce swelling in your legs. In some cases, you may need to go to the hospital for:  Fluid replacement. This means you will receive fluids through an IV tube.  Blood replacement. This means you will receive donated blood through an IV tube (transfusion).  Treating an infection or heart problems, if this applies.  Monitoring. You may need to be monitored while medicines that you are taking wear off. Follow these instructions at home: Eating and drinking   Drink enough fluids to keep your pee (urine) pale yellow.  Eat a healthy diet.  Follow instructions from your doctor about what you can eat or drink. A healthy diet includes: ? Fresh fruits and vegetables. ? Whole grains. ? Low-fat (lean) meats. ? Low-fat dairy products.  Eat extra salt only as told. Do not add extra salt to your diet unless your doctor tells you to.  Eat small meals often.  Avoid standing up quickly after you eat. Medicines  Take over-the-counter and prescription medicines only as told by your doctor. ? Follow instructions from your doctor about changing how much you take of your medicines, if this applies. ? Do not stop or change any of your medicines on your own. General instructions   Wear compression stockings as told by your doctor.  Get up slowly from lying down or sitting.  Avoid hot showers and a lot of heat as told by your doctor.  Return to your normal activities as told by your doctor. Ask what activities are safe for you.  Do not use any products that contain nicotine or tobacco, such as cigarettes, e-cigarettes, and chewing tobacco. If you need help quitting, ask your doctor.  Keep all follow-up visits as told by your doctor. This is important. Contact a doctor if:  You throw up (vomit).  You have watery poop (diarrhea).  You have a fever for more than 2-3 days.  You feel more thirsty than normal.  You feel weak and tired. Get help right away if:  You have chest pain.  You have a fast or uneven heartbeat.  You lose feeling (have numbness) in any   part of your body.  You cannot move your arms or your legs.  You have trouble talking.  You get sweaty or feel light-headed.  You pass out.  You have trouble breathing.  You have trouble staying awake.  You feel mixed up (confused). Summary  Hypotension is also called low blood pressure. It is when the force of blood pumping through your arteries is too weak.  Hypotension may be harmless, or it may cause serious problems.  Treatment may include changing  your diet and medicines, and wearing compression stockings.  In very bad cases, you may need to go to the hospital. This information is not intended to replace advice given to you by your health care provider. Make sure you discuss any questions you have with your health care provider. Document Revised: 05/17/2018 Document Reviewed: 05/17/2018 Elsevier Patient Education  2020 Elsevier Inc.  

## 2020-09-16 NOTE — Progress Notes (Signed)
BP 116/61    Pulse (!) 59    Temp 98.1 F (36.7 C) (Oral)    Wt 169 lb 9.6 oz (76.9 kg)    SpO2 95%    BMI 26.56 kg/m    Subjective:    Patient ID: Gerald Barry, male    DOB: [birth year], 85 y.o.   MRN: [id number]  HPI: Gerald Barry is a 85 y.o. male presenting for blood pressure follow-up.  Chief Complaint  Patient presents with   Hypotension    1 week f/up   HYPERTENSION At last visit, we stopped benazepril.  Was previously on HCTZ and benazepril and hydrochlorothiazide was stopped about 2 weeks ago.  He is still drinking plenty of water and has been checking his blood pressure at home.  His blood pressures have been ranging in the 120s over 60s to 130s over 70s range. Hypertension status: controlled  Satisfied with current treatment? no Duration of hypertension: chronic BP monitoring frequency:  Multiple times per day BP medication side effects:  no Medication compliance: excellent compliance Previous BP meds: hctz, benazepril Aspirin: no Recurrent headaches: no Visual changes: no Palpitations: no Dyspnea: no Chest pain: no Lower extremity edema: no Dizzy/lightheaded: no  No Known Allergies  Outpatient Encounter Medications as of 09/17/2020  Medication Sig   erythromycin ophthalmic ointment SMARTSIG:In Eye(s)   lovastatin (MEVACOR) 40 MG tablet Take 1 tablet (40 mg total) by mouth at bedtime.   pantoprazole (PROTONIX) 40 MG tablet Take 1 tablet (40 mg total) by mouth daily.   docusate sodium (COLACE) 50 MG capsule Take 50 mg by mouth daily.  (Patient not taking: Reported on 09/08/2020)   glycerin adult 2 g suppository Place 1 suppository rectally as needed for constipation. (Patient not taking: Reported on 09/08/2020)   polyethylene glycol powder (GLYCOLAX/MIRALAX) 17 GM/SCOOP powder Take 17 g by mouth daily. (Patient not taking: Reported on 09/08/2020)   senna (SENOKOT) 8.6 MG TABS tablet Take 1 tablet by mouth daily. (Patient not taking: Reported on  09/08/2020)   No facility-administered encounter medications on file as of 09/17/2020.   Patient Active Problem List   Diagnosis Date Noted   Hypotension due to drugs 09/08/2020   Constipation 02/10/2020   CKD (chronic kidney disease) stage 3, GFR 30-59 ml/min (HCC) 06/25/2018   Advanced care planning/counseling discussion 12/21/2017   Esophageal reflux 12/21/2017   Impacted cerumen of left ear 09/12/2017   Palpitations 10/31/2016   Essential hypertension 03/07/2016   Hyperlipidemia 03/07/2016   Eye cancer [facility])    Past Medical History:  Diagnosis Date   Arthritis    Chronic kidney disease    Eye cancer ([place])    Eye cancer ([place])    Hyperlipidemia    Prostate cancer ([place])    Vertigo, benign positional    Relevant past medical, surgical, family and social history reviewed and updated as indicated. Interim medical history since our last visit reviewed.   Review of Systems  Constitutional: Negative.   Eyes: Negative.   Respiratory: Negative.   Cardiovascular: Negative.   Skin: Negative.   Neurological: Negative.   Psychiatric/Behavioral: Negative.     Per HPI unless specifically indicated above     Objective:    BP 116/61    Pulse (!) 59    Temp 98.1 F (36.7 C) (Oral)    Wt 169 lb 9.6 oz (76.9 kg)    SpO2 95%    BMI 26.56 kg/m   Wt Readings from Last 3 Encounters:  09/17/20 169  lb 9.6 oz (76.9 kg)  09/11/20 167 lb (75.8 kg)  09/08/20 167 lb (75.8 kg)    Physical Exam Vitals and nursing note reviewed.  Constitutional:      General: He is not in acute distress.    Appearance: Normal appearance. He is not toxic-appearing.  Cardiovascular:     Rate and Rhythm: Regular rhythm. Bradycardia present.     Heart sounds: Normal heart sounds. No murmur heard.   Pulmonary:     Effort: Pulmonary effort is normal. No respiratory distress.     Breath sounds: Normal breath sounds. No wheezing, rhonchi or rales.  Skin:    General: Skin is warm and dry.      Coloration: Skin is not jaundiced or pale.     Findings: No erythema.  Neurological:     Mental Status: He is alert and oriented to person, place, and time. Mental status is at baseline.     Motor: No weakness.     Gait: Gait normal.  Psychiatric:        Mood and Affect: Mood normal.        Behavior: Behavior normal.        Thought Content: Thought content normal.        Judgment: Judgment normal.       Assessment & Plan:   Problem List Items Addressed This Visit      Cardiovascular and Mediastinum   Essential hypertension - Primary    Chronic, stable without any medication.  Recently stopped hydrochlorothiazide and benazepril due to hypotension.  Patient is currently working on increasing water intake and limiting amount of sweet tea.  We will continue to hold blood pressure medication and follow-up in about 6 months.  Patient instructed to monitor blood pressure at home and if blood pressure runs greater than 336 systolic, return to clinic sooner.          Follow up plan: Return in about 6 months (around 03/18/2021) for BP f/u, HLD.

## 2020-09-17 ENCOUNTER — Ambulatory Visit: Payer: Medicare Other | Admitting: Nurse Practitioner

## 2020-09-17 ENCOUNTER — Other Ambulatory Visit: Payer: Self-pay

## 2020-09-17 ENCOUNTER — Encounter: Payer: Self-pay | Admitting: Nurse Practitioner

## 2020-09-17 VITALS — BP 116/61 | HR 59 | Temp 98.1°F | Wt 169.6 lb

## 2020-09-17 DIAGNOSIS — I10 Essential (primary) hypertension: Secondary | ICD-10-CM

## 2020-09-17 NOTE — Assessment & Plan Note (Signed)
Chronic, stable without any medication.  Recently stopped hydrochlorothiazide and benazepril due to hypotension.  Patient is currently working on increasing water intake and limiting amount of sweet tea.  We will continue to hold blood pressure medication and follow-up in about 6 months.  Patient instructed to monitor blood pressure at home and if blood pressure runs greater than 791 systolic, return to clinic sooner.

## 2020-09-17 NOTE — Patient Instructions (Signed)
Mr. Bischof,  Be sure to let us know if your BP is staying higher than 170 on top. Take care, Janett Billow

## 2021-05-06 ENCOUNTER — Telehealth: Payer: Self-pay | Admitting: Internal Medicine

## 2021-05-06 NOTE — Telephone Encounter (Signed)
Copied from [place] [id number]. Topic: Medicare AWV >> May 06, 2021  2:33 PM Cher Nakai R wrote: Reason for CRM:  No answer unable to leave a message for patient to call back and schedule the Medicare Annual Wellness Visit (AWV) virtually or by telephone.  Last AWV  02/10/2020  Please schedule at anytime with [facility]Nurse Health Advisor.  45 minute appointment  Any questions, please call me at [phone]

## 2021-05-17 DIAGNOSIS — Z961 Presence of intraocular lens: Secondary | ICD-10-CM | POA: Diagnosis not present

## 2021-05-24 ENCOUNTER — Other Ambulatory Visit: Payer: Self-pay

## 2021-05-24 DIAGNOSIS — E785 Hyperlipidemia, unspecified: Secondary | ICD-10-CM

## 2021-05-24 NOTE — Telephone Encounter (Signed)
Request for refill of Lovastatin 40mg  tab, QHS  90QTY 1 RF

## 2021-05-27 MED ORDER — LOVASTATIN 40 MG PO TABS: 40.0000 mg | ORAL_TABLET | Freq: Every day | ORAL | 0 refills | Status: DC

## 2021-06-10 ENCOUNTER — Other Ambulatory Visit: Payer: Self-pay

## 2021-06-10 ENCOUNTER — Other Ambulatory Visit: Payer: Self-pay | Admitting: Nurse Practitioner

## 2021-06-10 ENCOUNTER — Telehealth: Payer: Self-pay | Admitting: Internal Medicine

## 2021-06-10 ENCOUNTER — Ambulatory Visit (INDEPENDENT_AMBULATORY_CARE_PROVIDER_SITE_OTHER): Payer: Medicare HMO | Admitting: Internal Medicine

## 2021-06-10 ENCOUNTER — Encounter: Payer: Self-pay | Admitting: Internal Medicine

## 2021-06-10 VITALS — BP 110/60 | HR 48 | Temp 98.7°F | Ht 67.01 in | Wt 160.0 lb

## 2021-06-10 DIAGNOSIS — N1832 Chronic kidney disease, stage 3b: Secondary | ICD-10-CM

## 2021-06-10 DIAGNOSIS — Z131 Encounter for screening for diabetes mellitus: Secondary | ICD-10-CM

## 2021-06-10 DIAGNOSIS — Z125 Encounter for screening for malignant neoplasm of prostate: Secondary | ICD-10-CM

## 2021-06-10 DIAGNOSIS — H9311 Tinnitus, right ear: Secondary | ICD-10-CM | POA: Diagnosis not present

## 2021-06-10 DIAGNOSIS — Z1329 Encounter for screening for other suspected endocrine disorder: Secondary | ICD-10-CM | POA: Diagnosis not present

## 2021-06-10 DIAGNOSIS — E785 Hyperlipidemia, unspecified: Secondary | ICD-10-CM | POA: Diagnosis not present

## 2021-06-10 DIAGNOSIS — Z136 Encounter for screening for cardiovascular disorders: Secondary | ICD-10-CM | POA: Diagnosis not present

## 2021-06-10 LAB — URINALYSIS, ROUTINE W REFLEX MICROSCOPIC
Bilirubin, UA: NEGATIVE
Glucose, UA: NEGATIVE
Ketones, UA: NEGATIVE
Leukocytes,UA: NEGATIVE
Nitrite, UA: NEGATIVE
Protein,UA: NEGATIVE
Specific Gravity, UA: 1.025 (ref 1.005–1.030)
Urobilinogen, Ur: 0.2 mg/dL (ref 0.2–1.0)
pH, UA: 5 (ref 5.0–7.5)

## 2021-06-10 LAB — MICROSCOPIC EXAMINATION
Bacteria, UA: NONE SEEN
WBC, UA: NONE SEEN /hpf (ref 0–5)

## 2021-06-10 MED ORDER — DEBROX 6.5 % OT SOLN: 5.0000 [drp] | Freq: Two times a day (BID) | OTIC | 1 refills | Status: AC

## 2021-06-10 NOTE — Progress Notes (Signed)
BP 110/60   Pulse (!) 48   Temp 98.7 F (37.1 C) (Oral)   Ht 5' 7.01" (1.702 m)   Wt 160 lb (72.6 kg)   SpO2 98%   BMI 25.05 kg/m    Subjective:    Patient ID: Gerald Barry, male    DOB: 1935-02-15, 86 y.o.   MRN: [id number]  Chief Complaint  Patient presents with  . Chronic Kidney Disease  . Hyperlipidemia  . Gastroesophageal Reflux  . Left Ear Pain    Says it feels like something is growing in his ear and makes noise at night     HPI: Gerald Barry is a 86 y.o. male  Pt is hee   Hyperlipidemia  Gastroesophageal Reflux  Ear Fullness    Chief Complaint  Patient presents with  . Chronic Kidney Disease  . Hyperlipidemia  . Gastroesophageal Reflux  . Left Ear Pain    Says it feels like something is growing in his ear and makes noise at night     Relevant past medical, surgical, family and social history reviewed and updated as indicated. Interim medical history since our last visit reviewed. Allergies and medications reviewed and updated.  Review of Systems  Per HPI unless specifically indicated above     Objective:    BP 110/60   Pulse (!) 48   Temp 98.7 F (37.1 C) (Oral)   Ht 5' 7.01" (1.702 m)   Wt 160 lb (72.6 kg)   SpO2 98%   BMI 25.05 kg/m   Wt Readings from Last 3 Encounters:  06/10/21 160 lb (72.6 kg)  09/17/20 169 lb 9.6 oz (76.9 kg)  09/11/20 167 lb (75.8 kg)    Physical Exam Vitals and nursing note reviewed.  Constitutional:      General: He is not in acute distress.    Appearance: Normal appearance. He is not ill-appearing or diaphoretic.  HENT:     Head: Normocephalic and atraumatic.     Right Ear: Tympanic membrane and external ear normal. There is no impacted cerumen.     Left Ear: External ear normal. There is impacted cerumen.     Ears:     Comments: Cerumen ++ noted blocking the external ear canal.     Nose: No congestion or rhinorrhea.     Mouth/Throat:     Pharynx: No oropharyngeal exudate or posterior  oropharyngeal erythema.  Eyes:     Conjunctiva/sclera: Conjunctivae normal.     Pupils: Pupils are equal, round, and reactive to light.  Cardiovascular:     Rate and Rhythm: Normal rate and regular rhythm.     Heart sounds: No murmur heard.   No friction rub. No gallop.  Pulmonary:     Effort: No respiratory distress.     Breath sounds: No stridor. No wheezing or rhonchi.  Chest:     Chest wall: No tenderness.  Abdominal:     General: Abdomen is flat. Bowel sounds are normal.     Palpations: Abdomen is soft. There is no mass.     Tenderness: There is no abdominal tenderness.  Musculoskeletal:     Cervical back: Normal range of motion and neck supple. No rigidity or tenderness.     Left lower leg: No edema.  Neurological:     Mental Status: He is alert.   Results for orders placed or performed in visit on 09/08/20  Comprehensive metabolic panel  Result Value Ref Range   Glucose 87 65 - 99 mg/dL  BUN 33 (H) 8 - 27 mg/dL   Creatinine, Ser 1.84 (H) 0.76 - 1.27 mg/dL   GFR calc non Af Amer 33 (L) >59 mL/min/1.73   GFR calc Af Amer 39 (L) >59 mL/min/1.73   BUN/Creatinine Ratio 18 10 - 24   Sodium 140 134 - 144 mmol/L   Potassium 4.0 3.5 - 5.2 mmol/L   Chloride 100 96 - 106 mmol/L   CO2 25 20 - 29 mmol/L   Calcium 10.0 8.6 - 10.2 mg/dL   Total Protein 6.9 6.0 - 8.5 g/dL   Albumin 4.4 3.6 - 4.6 g/dL   Globulin, Total 2.5 1.5 - 4.5 g/dL   Albumin/Globulin Ratio 1.8 1.2 - 2.2   Bilirubin Total 0.5 0.0 - 1.2 mg/dL   Alkaline Phosphatase 80 44 - 121 IU/L   AST 16 0 - 40 IU/L   ALT 11 0 - 44 IU/L        Current Outpatient Medications:  .  lovastatin (MEVACOR) 40 MG tablet, Take 1 tablet (40 mg total) by mouth at bedtime. Needs appt with PCP prior to further refills, Disp: 30 tablet, Rfl: 0 .  pantoprazole (PROTONIX) 40 MG tablet, Take 1 tablet (40 mg total) by mouth daily., Disp: 90 tablet, Rfl: 0    Assessment & Plan:  CKD:  Sees nephrology for such.  Stable.   Ref.  Range 02/10/2020 10:59 09/08/2020 13:59  Creatinine Latest Ref Range: 0.76 - 1.27 mg/dL 1.89 (H) 1.84 (H)  Calcium Latest Ref Range: 8.6 - 10.2 mg/dL 9.9 10.0    Cerumen disimpaction/ tinnitus : Ear clean today Debrox sent to pharmacy Pt to fu with me if feels worse   Problem List Items Addressed This Visit       Genitourinary   CKD (chronic kidney disease) stage 3, GFR 30-59 ml/min (HCC)   Relevant Orders   CBC with Differential/Platelet     Other   Hyperlipidemia   Relevant Orders   TSH (Completed)   PSA (Completed)   Lipid panel (Completed)   CBC with Differential/Platelet (Completed)   Comprehensive metabolic panel (Completed)   Urinalysis, Routine w reflex microscopic (Completed)   CBC with Differential/Platelet   Lipid panel   CMP14+EGFR   Other Visit Diagnoses     Tinnitus aurium, right    -  Primary   Relevant Orders   CBC with Differential/Platelet   CMP14+EGFR   Screening for thyroid disorder       Relevant Orders   Thyroid Panel With TSH   Screening for heart disease       Relevant Orders   Lipid panel   Screening PSA (prostate specific antigen)       Relevant Orders   PSA Total+%Free (Serial)   Screening for diabetes mellitus (DM)       Relevant Orders   Bayer DCA Hb A1c Waived        Orders Placed This Encounter  Procedures  . Ambulatory referral to ENT     Meds ordered this encounter  Medications  . carbamide peroxide (DEBROX) 6.5 % OTIC solution    Sig: Place 5 drops into both ears 2 (two) times daily.    Dispense:  15 mL    Refill:  1     Follow up plan: No follow-ups on file.

## 2021-06-11 ENCOUNTER — Encounter: Payer: Self-pay | Admitting: Internal Medicine

## 2021-06-11 LAB — TSH: TSH: 2.56 u[IU]/mL (ref 0.450–4.500)

## 2021-06-11 LAB — CBC WITH DIFFERENTIAL/PLATELET
Basophils Absolute: 0.1 10*3/uL (ref 0.0–0.2)
Basos: 1 %
EOS (ABSOLUTE): 0.1 10*3/uL (ref 0.0–0.4)
Eos: 2 %
Hematocrit: 41.9 % (ref 37.5–51.0)
Hemoglobin: 13.7 g/dL (ref 13.0–17.7)
Immature Grans (Abs): 0.1 10*3/uL (ref 0.0–0.1)
Immature Granulocytes: 1 %
Lymphocytes Absolute: 2.9 10*3/uL (ref 0.7–3.1)
Lymphs: 42 %
MCH: 29 pg (ref 26.6–33.0)
MCHC: 32.7 g/dL (ref 31.5–35.7)
MCV: 89 fL (ref 79–97)
Monocytes Absolute: 0.4 10*3/uL (ref 0.1–0.9)
Monocytes: 6 %
Neutrophils Absolute: 3.4 10*3/uL (ref 1.4–7.0)
Neutrophils: 48 %
Platelets: 150 10*3/uL (ref 150–450)
RBC: 4.73 x10E6/uL (ref 4.14–5.80)
RDW: 13.8 % (ref 11.6–15.4)
WBC: 6.9 10*3/uL (ref 3.4–10.8)

## 2021-06-11 LAB — COMPREHENSIVE METABOLIC PANEL
ALT: 10 IU/L (ref 0–44)
AST: 15 IU/L (ref 0–40)
Albumin/Globulin Ratio: 1.9 (ref 1.2–2.2)
Albumin: 4.3 g/dL (ref 3.6–4.6)
Alkaline Phosphatase: 71 IU/L (ref 44–121)
BUN/Creatinine Ratio: 18 (ref 10–24)
BUN: 25 mg/dL (ref 8–27)
Bilirubin Total: 0.4 mg/dL (ref 0.0–1.2)
CO2: 22 mmol/L (ref 20–29)
Calcium: 9.4 mg/dL (ref 8.6–10.2)
Chloride: 102 mmol/L (ref 96–106)
Creatinine, Ser: 1.4 mg/dL — ABNORMAL HIGH (ref 0.76–1.27)
Globulin, Total: 2.3 g/dL (ref 1.5–4.5)
Glucose: 83 mg/dL (ref 65–99)
Potassium: 4.3 mmol/L (ref 3.5–5.2)
Sodium: 140 mmol/L (ref 134–144)
Total Protein: 6.6 g/dL (ref 6.0–8.5)
eGFR: 50 mL/min/{1.73_m2} — ABNORMAL LOW (ref >59)

## 2021-06-11 LAB — LIPID PANEL
Chol/HDL Ratio: 3.2 ratio (ref 0.0–5.0)
Cholesterol, Total: 145 mg/dL (ref 100–199)
HDL: 45 mg/dL (ref >39)
LDL Chol Calc (NIH): 77 mg/dL (ref 0–99)
Triglycerides: 132 mg/dL (ref 0–149)
VLDL Cholesterol Cal: 23 mg/dL (ref 5–40)

## 2021-06-11 LAB — PSA: Prostate Specific Ag, Serum: 0.3 ng/mL (ref 0.0–4.0)

## 2021-07-12 ENCOUNTER — Other Ambulatory Visit: Payer: Self-pay | Admitting: Internal Medicine

## 2021-07-12 DIAGNOSIS — E785 Hyperlipidemia, unspecified: Secondary | ICD-10-CM

## 2021-07-13 NOTE — Telephone Encounter (Signed)
error 

## 2021-07-16 ENCOUNTER — Other Ambulatory Visit: Payer: Self-pay | Admitting: Internal Medicine

## 2021-07-16 DIAGNOSIS — E785 Hyperlipidemia, unspecified: Secondary | ICD-10-CM

## 2021-08-12 ENCOUNTER — Other Ambulatory Visit: Payer: Self-pay | Admitting: Internal Medicine

## 2021-08-12 DIAGNOSIS — E785 Hyperlipidemia, unspecified: Secondary | ICD-10-CM

## 2021-08-13 NOTE — Telephone Encounter (Signed)
Patient last seen 06/10/21, normal lipid panel at visit.

## 2021-08-13 NOTE — Telephone Encounter (Signed)
Requested medications are due for refill today yes  Requested medications are on the active medication list yes  Last refill 8/8  Last visit 8/8  Future visit scheduled 12/2021  Notes to clinic Has already had a curtesy refill 8/8, there is an upcoming appointment scheduled in Jan 2023.

## 2021-08-19 ENCOUNTER — Other Ambulatory Visit: Payer: Self-pay | Admitting: Family Medicine

## 2021-08-19 ENCOUNTER — Other Ambulatory Visit: Payer: Self-pay | Admitting: Internal Medicine

## 2021-08-19 DIAGNOSIS — E785 Hyperlipidemia, unspecified: Secondary | ICD-10-CM

## 2021-09-30 ENCOUNTER — Other Ambulatory Visit: Payer: Self-pay

## 2021-09-30 ENCOUNTER — Ambulatory Visit
Admission: RE | Admit: 2021-09-30 | Discharge: 2021-09-30 | Disposition: A | Discharge status: Home / Self Care | Payer: Medicare HMO | Source: Ambulatory Visit | Attending: Internal Medicine | Admitting: Internal Medicine

## 2021-10-05 DIAGNOSIS — C44319 Basal cell carcinoma of skin of other parts of face: Secondary | ICD-10-CM | POA: Diagnosis not present

## 2021-10-05 DIAGNOSIS — D485 Neoplasm of uncertain behavior of skin: Secondary | ICD-10-CM | POA: Diagnosis not present

## 2021-10-05 DIAGNOSIS — C4441 Basal cell carcinoma of skin of scalp and neck: Secondary | ICD-10-CM | POA: Diagnosis not present

## 2021-10-05 DIAGNOSIS — Z85828 Personal history of other malignant neoplasm of skin: Secondary | ICD-10-CM | POA: Diagnosis not present

## 2021-10-19 DIAGNOSIS — C4441 Basal cell carcinoma of skin of scalp and neck: Secondary | ICD-10-CM | POA: Diagnosis not present

## 2021-10-19 DIAGNOSIS — D485 Neoplasm of uncertain behavior of skin: Secondary | ICD-10-CM | POA: Diagnosis not present

## 2021-11-04 DIAGNOSIS — C4441 Basal cell carcinoma of skin of scalp and neck: Secondary | ICD-10-CM | POA: Diagnosis not present

## 2021-11-04 DIAGNOSIS — C4491 Basal cell carcinoma of skin, unspecified: Secondary | ICD-10-CM | POA: Diagnosis not present

## 2021-11-18 DIAGNOSIS — L905 Scar conditions and fibrosis of skin: Secondary | ICD-10-CM | POA: Diagnosis not present

## 2021-11-18 DIAGNOSIS — C4491 Basal cell carcinoma of skin, unspecified: Secondary | ICD-10-CM | POA: Diagnosis not present

## 2021-12-07 ENCOUNTER — Other Ambulatory Visit: Payer: Medicare HMO

## 2021-12-09 DIAGNOSIS — C4491 Basal cell carcinoma of skin, unspecified: Secondary | ICD-10-CM | POA: Diagnosis not present

## 2021-12-09 DIAGNOSIS — C4441 Basal cell carcinoma of skin of scalp and neck: Secondary | ICD-10-CM | POA: Diagnosis not present

## 2021-12-13 ENCOUNTER — Ambulatory Visit: Payer: Medicare HMO | Admitting: Internal Medicine

## 2022-01-03 DIAGNOSIS — H524 Presbyopia: Secondary | ICD-10-CM | POA: Diagnosis not present

## 2022-01-03 DIAGNOSIS — H353111 Nonexudative age-related macular degeneration, right eye, early dry stage: Secondary | ICD-10-CM | POA: Diagnosis not present

## 2022-01-04 DIAGNOSIS — Z0100 Encounter for examination of eyes and vision without abnormal findings: Secondary | ICD-10-CM | POA: Diagnosis not present

## 2022-01-06 ENCOUNTER — Other Ambulatory Visit: Payer: Self-pay | Admitting: Internal Medicine

## 2022-01-06 NOTE — Telephone Encounter (Signed)
Requested Prescriptions  Pending Prescriptions Disp Refills   pantoprazole (PROTONIX) 40 MG tablet [Pharmacy Med Name: PANTOPRAZOLE SOD DR 40 MG TAB] 30 tablet 0    Sig: Take 1 tablet (40 mg total) by mouth daily.     Gastroenterology: Proton Pump Inhibitors Passed - 01/06/2022  9:57 AM      Passed - Valid encounter within last 12 months    Recent Outpatient Visits          7 months ago Tinnitus aurium, right   [facility] Vigg, Avanti, MD   1 year ago Essential hypertension   [place] Eulogio Bear, NP   1 year ago Hypotension due to drugs   [facility] Eulogio Bear, NP   1 year ago Hypotension due to drugs   [facility] Eulogio Bear, NP   1 year ago Stage 3b chronic kidney disease   [facility] Eulogio Bear, NP

## 2022-01-31 ENCOUNTER — Ambulatory Visit: Payer: Medicare HMO

## 2022-02-08 ENCOUNTER — Other Ambulatory Visit: Payer: Self-pay | Admitting: Internal Medicine

## 2022-02-09 NOTE — Telephone Encounter (Signed)
Requested medication (s) are due for refill today: yes ? ?Requested medication (s) are on the active medication list: yes ? ?Last refill:  01/06/22 #30 0 refill ? ?Future visit scheduled: no ? ?Notes to clinic:  no refills left. Do you want to refill Rx? Attempted to contact patient to schedule appt. Last 3 appt no show, 12/07/21, 12/13/21, 01/31/22. Patient is 16. ? ? ?  ?Requested Prescriptions  ?Pending Prescriptions Disp Refills  ? pantoprazole (PROTONIX) 40 MG tablet [Pharmacy Med Name: PANTOPRAZOLE SOD DR 40 MG TAB] 30 tablet 0  ?  Sig: Take 1 tablet (40 mg total) by mouth daily.  ?  ? Gastroenterology: Proton Pump Inhibitors Passed - 02/08/2022  1:00 PM  ?  ?  Passed - Valid encounter within last 12 months  ?  Recent Outpatient Visits   ? ?      ? 8 months ago Tinnitus aurium, right  ? [facility] Vigg, Avanti, MD  ? 1 year ago Essential hypertension  ? [place], NP  ? 1 year ago Hypotension due to drugs  ? [place], NP  ? 1 year ago Hypotension due to drugs  ? [place], NP  ? 2 years ago Stage 3b chronic kidney disease  ? [facility] Eulogio Bear, NP  ? ?  ?  ? ?  ?  ?  ? ?

## 2022-02-09 NOTE — Telephone Encounter (Signed)
Called patient/ spouse to schedule future appt. For medication refills  No answer, voicemail box has not been set up. Unable to leave message. ?

## 2022-05-24 ENCOUNTER — Ambulatory Visit (INDEPENDENT_AMBULATORY_CARE_PROVIDER_SITE_OTHER): Payer: Medicare HMO | Admitting: *Deleted

## 2022-05-24 DIAGNOSIS — Z0000 Encounter for general adult medical examination without abnormal findings: Secondary | ICD-10-CM | POA: Diagnosis not present

## 2022-05-24 NOTE — Progress Notes (Signed)
Subjective:   Gerald Barry is a 87 y.o. male who presents for Medicare Annual/Subsequent preventive examination.  I connected with  [name] on 05/24/22 by a telephone enabled telemedicine application and verified that I am speaking with the correct person using two identifiers.   I discussed the limitations of evaluation and management by telemedicine. The patient expressed understanding and agreed to proceed.  Patient location: home  Provider location: Tele-Health-home   Review of Systems     Cardiac Risk Factors include: advanced age (>88mn, >>38women);hypertension;male gender;sedentary lifestyle     Objective:    There were no vitals filed for this visit. There is no height or weight on file to calculate BMI.     05/24/2022   11:07 AM 02/10/2020    9:50 AM 02/10/2020    9:49 AM 05/06/2015    5:16 PM  Advanced Directives  Does Patient Have a Medical Advance Directive? No No Yes No  Would patient like information on creating a medical advance directive? No - Patient declined       Current Medications (verified) Outpatient Encounter Medications as of 05/24/2022  Medication Sig   carbamide peroxide (DEBROX) 6.5 % OTIC solution Place 5 drops into both ears 2 (two) times daily. (Patient not taking: Reported on 05/24/2022)   lovastatin (MEVACOR) 40 MG tablet Take 1 tablet (40 mg total) by mouth at bedtime. (Patient not taking: Reported on 05/24/2022)   pantoprazole (PROTONIX) 40 MG tablet Take 1 tablet (40 mg total) by mouth daily. (Patient not taking: Reported on 05/24/2022)   No facility-administered encounter medications on file as of 05/24/2022.    Allergies (verified) Patient has no known allergies.   History: Past Medical History:  Diagnosis Date   Arthritis    Chronic kidney disease    [place]    [place]    Hyperlipidemia    [place]    Vertigo, benign positional    Past Surgical History:  Procedure Laterality Date   EYE  SURGERY     cataract   JOINT REPLACEMENT Left    knee   SKIN GRAFT Left    hand   Family History  Problem Relation Age of Onset   Cancer Mother    Social History   Socioeconomic History   Marital status: Married    Spouse name: Not on file   Number of children: Not on file   Years of education: Not on file   Highest education level: Not on file  Occupational History   Occupation: retired  Tobacco Use   Smoking status: Never   Smokeless tobacco: Current    Types: Chew  Vaping Use   Vaping Use: Never used  Substance and Sexual Activity   Alcohol use: No   Drug use: No   Sexual activity: Not Currently  Other Topics Concern   Not on file  Social History Narrative   Not on file   Social Determinants of Health   Financial Resource Strain: Low Risk  (05/24/2022)   Overall Financial Resource Strain (CARDIA)    Difficulty of Paying Living Expenses: Not hard at all  Food Insecurity: No Food Insecurity (05/24/2022)   Hunger Vital Sign    Worried About Running Out of Food in the Last Year: Never true    R[place]cast[place] the Last Year: Never true  Transportation Needs: No Transportation Needs (05/24/2022)   PRAPARE - THydrologist(Medical): No  Lack of Transportation (Non-Medical): No  Physical Activity: Inactive (05/24/2022)   Exercise Vital Sign    Days of Exercise per Week: 0 days    Minutes of Exercise per Session: 0 min  Stress: No Stress Concern Present (05/24/2022)   [place]    Feeling of Stress : Not at all  Social Connections: Moderately Isolated (05/24/2022)   Social Connection and Isolation Panel [NHANES]    Frequency of Communication with Friends and Family: Never    Frequency of Social Gatherings with Friends and Family: Twice a week    Attends Religious Services: More than 4 times per year    Active Member of [employer] or Organizations: No    Attends Programme researcher, broadcasting/film/video: Never    Marital Status: Married    Tobacco Counseling Ready to quit: Not Answered Counseling given: Not Answered   Clinical Intake:  Pre-visit preparation completed: Yes  Pain : No/denies pain     Nutritional Risks: None Diabetes: No  How often do you need to have someone help you when you read instructions, pamphlets, or other written materials from your doctor or pharmacy?: 1 - Never  Diabetic?  no  Interpreter Needed?: No  Information entered by :: Leroy Kennedy LPN   Activities of Daily Living    05/24/2022   11:17 AM 06/10/2021    2:30 PM  In your present state of health, do you have any difficulty performing the following activities:  Hearing? 1 1  Comment deaf in one ear   does not wear hearing aids   Vision? 0 1  Difficulty concentrating or making decisions? 0 1  Walking or climbing stairs? 1 1  Dressing or bathing? 0 0  Doing errands, shopping? 1 0  Preparing Food and eating ? N   Using the Toilet? N   In the past six months, have you accidently leaked urine? N   Do you have problems with loss of bowel control? N   Managing your Medications? N   Managing your Finances? N   Housekeeping or managing your Housekeeping? Y     Patient Care Team: Charlynne Cousins, MD as PCP - General Guadalupe Maple, MD as PCP - Family Medicine (Family Medicine) Clyde Canterbury, MD as Referring Physician (Otolaryngology) Anthonette Legato, MD (Nephrology)  Indicate any recent Medical Services you may have received from other than [facility] providers in the past year (date may be approximate).     Assessment:   This is a routine wellness examination for [name].  Hearing/Vision screen Hearing Screening - Comments:: Does not wear hearing aids Deaf in left ear Vision Screening - Comments:: Up to date [facility]  Dietary issues and exercise activities discussed: Current Exercise Habits: The patient does not participate in regular exercise at present,  Exercise limited by: orthopedic condition(s)   Goals Addressed             This Visit's Progress    Patient Stated       No goals      Depression Screen    05/24/2022   11:16 AM 06/10/2021    2:29 PM 02/10/2020    9:46 AM 12/21/2017    9:12 AM 09/12/2017    2:04 PM 04/25/2016    1:37 PM  PHQ 2/9 Scores  PHQ - 2 Score 0 0 0 0 0 0  PHQ- 9 Score  0        Fall Risk    05/24/2022  11:04 AM 06/10/2021    2:29 PM 02/10/2020    9:45 AM 06/21/2018    8:19 AM 04/03/2018   10:42 AM  Fall Risk   Falls in the past year? 0 0 1 No Yes  Number falls in past yr: 0 0 0  1  Injury with Fall? 0 0 0  Yes  Risk Factor Category      High Fall Risk  Risk for fall due to : Impaired balance/gait No Fall Risks     Follow up Falls evaluation completed;Education provided;Falls prevention discussed Falls evaluation completed   Falls evaluation completed    FALL RISK PREVENTION PERTAINING TO THE HOME:  Any stairs in or around the home? No  If so, are there any without handrails? No  Home free of loose throw rugs in walkways, pet beds, electrical cords, etc? Yes  Adequate lighting in your home to reduce risk of falls? Yes   ASSISTIVE DEVICES UTILIZED TO PREVENT FALLS:  Life alert? No  Use of a cane, walker or w/c? Yes  Grab bars in the bathroom? Yes  Shower chair or bench in shower? Yes  Elevated toilet seat or a handicapped toilet? No   TIMED UP AND GO:  Was the test performed? No .    Cognitive Function:        05/24/2022   11:05 AM 02/10/2020    9:46 AM  6CIT Screen  What Year? 0 points 0 points  What month? 0 points 0 points  What time? 0 points 0 points  Count back from 20 0 points 0 points  Months in reverse 4 points 0 points  Repeat phrase 0 points 0 points  Total Score 4 points 0 points    Immunizations Immunization History  Administered Date(s) Administered   Fluad Quad(high Dose 65+) 09/08/2020   Influenza, High Dose Seasonal PF 10/17/2016, 09/12/2017   Pneumococcal  Conjugate-13 08/04/2014   Pneumococcal-Unspecified 07/15/1978, 06/04/2002   Tdap 05/06/2015   Zoster, Live 01/21/2008    TDAP status: Up to date  Flu Vaccine status: Due, Education has been provided regarding the importance of this vaccine. Advised may receive this vaccine at local pharmacy or Health Dept. Aware to provide a copy of the vaccination record if obtained from local pharmacy or Health Dept. Verbalized acceptance and understanding.  Pneumococcal vaccine status: Up to date  Covid-19 vaccine status: Declined, Education has been provided regarding the importance of this vaccine but patient still declined. Advised may receive this vaccine at local pharmacy or Health Dept.or vaccine clinic. Aware to provide a copy of the vaccination record if obtained from local pharmacy or Health Dept. Verbalized acceptance and understanding.  Qualifies for Shingles Vaccine? No   Zostavax completed Yes   Shingrix Completed?: No.    Education has been provided regarding the importance of this vaccine. Patient has been advised to call insurance company to determine out of pocket expense if they have not yet received this vaccine. Advised may also receive vaccine at local pharmacy or Health Dept. Verbalized acceptance and understanding.  Screening Tests Health Maintenance  Topic Date Due   COVID-19 Vaccine (1) 06/09/2022 (Originally 04/17/1936)   Zoster Vaccines- Shingrix (1 of 2) 08/24/2022 (Originally 10/18/1954)   Pneumonia Vaccine 39+ Years old (2 - PPSV23 if available, else PCV20) 05/25/2023 (Originally 09/29/2014)   INFLUENZA VACCINE  07/05/2022   TETANUS/TDAP  05/05/2025   HPV VACCINES  Aged Out    Health Maintenance  There are no preventive care reminders to display for  this patient.   Colorectal cancer screening: No longer required.   Lung Cancer Screening: (Low Dose CT Chest recommended if Age 70-80 years, 30 pack-year currently smoking OR have quit w/in 15years.) does not qualify.    Lung Cancer Screening Referral:   Additional Screening:  Hepatitis C Screening: does not qualify;   Vision Screening: Recommended annual ophthalmology exams for early detection of glaucoma and other disorders of the eye. Is the patient up to date with their annual eye exam?  Yes  Who is the provider or what is the name of the office in which the patient attends annual eye exams? [facility] If pt is not established with a provider, would they like to be referred to a provider to establish care? No .   Dental Screening: Recommended annual dental exams for proper oral hygiene  Community Resource Referral / Chronic Care Management: CRR required this visit?  No   CCM required this visit?  No      Plan:     I have personally reviewed and noted the following in the patient's chart:   Medical and social history Use of alcohol, tobacco or illicit drugs  Current medications and supplements including opioid prescriptions. Patient is not currently taking opioid prescriptions. Functional ability and status Nutritional status Physical activity Advanced directives List of other physicians Hospitalizations, surgeries, and ER visits in previous 12 months Vitals Screenings to include cognitive, depression, and falls Referrals and appointments  In addition, I have reviewed and discussed with patient certain preventive protocols, quality metrics, and best practice recommendations. A written personalized care plan for preventive services as well as general preventive health recommendations were provided to patient.     Leroy Kennedy, LPN   [id number]   Nurse Notes: patient stated he is not taking any medications , he tried to get them filled and they never were filled.  I attempted to make an appointment patient no showed in January, I explained he would probably need to be seen.  He stated he would wait for a call from office.

## 2022-05-24 NOTE — Patient Instructions (Signed)
Gerald Barry , Thank you for taking time to come for your Medicare Wellness Visit. I appreciate your ongoing commitment to your health goals. Please review the following plan we discussed and let me know if I can assist you in the future.   Screening recommendations/referrals: Colonoscopy: no longer required Recommended yearly ophthalmology/optometry visit for glaucoma screening and checkup Recommended yearly dental visit for hygiene and checkup  Vaccinations: Influenza vaccine: Education provided Pneumococcal vaccine: up to date Tdap vaccine: up to date Shingles vaccine: Education provided    Advanced directives: Education provided  Conditions/risks identified:     Preventive Care 24 Years and Older, Male Preventive care refers to lifestyle choices and visits with your health care provider that can promote health and wellness. What does preventive care include? A yearly physical exam. This is also called an annual well check. Dental exams once or twice a year. Routine eye exams. Ask your health care provider how often you should have your eyes checked. Personal lifestyle choices, including: Daily care of your teeth and gums. Regular physical activity. Eating a healthy diet. Avoiding tobacco and drug use. Limiting alcohol use. Practicing safe sex. Taking low doses of aspirin every day. Taking vitamin and mineral supplements as recommended by your health care provider. What happens during an annual well check? The services and screenings done by your health care provider during your annual well check will depend on your age, overall health, lifestyle risk factors, and family history of disease. Counseling  Your health care provider may ask you questions about your: Alcohol use. Tobacco use. Drug use. Emotional well-being. Home and relationship well-being. Sexual activity. Eating habits. History of falls. Memory and ability to understand (cognition). Work and work  Statistician. Screening  You may have the following tests or measurements: Height, weight, and BMI. Blood pressure. Lipid and cholesterol levels. These may be checked every 5 years, or more frequently if you are over 24 years old. Skin check. Lung cancer screening. You may have this screening every year starting at age 28 if you have a 30-pack-year history of smoking and currently smoke or have quit within the past 15 years. Fecal occult blood test (FOBT) of the stool. You may have this test every year starting at age 17. Flexible sigmoidoscopy or colonoscopy. You may have a sigmoidoscopy every 5 years or a colonoscopy every 10 years starting at age 34. Prostate cancer screening. Recommendations will vary depending on your family history and other risks. Hepatitis C blood test. Hepatitis B blood test. Sexually transmitted disease (STD) testing. Diabetes screening. This is done by checking your blood sugar (glucose) after you have not eaten for a while (fasting). You may have this done every 1-3 years. Abdominal aortic aneurysm (AAA) screening. You may need this if you are a current or former smoker. Osteoporosis. You may be screened starting at age 6 if you are at high risk. Talk with your health care provider about your test results, treatment options, and if necessary, the need for more tests. Vaccines  Your health care provider may recommend certain vaccines, such as: Influenza vaccine. This is recommended every year. Tetanus, diphtheria, and acellular pertussis (Tdap, Td) vaccine. You may need a Td booster every 10 years. Zoster vaccine. You may need this after age 30. Pneumococcal 13-valent conjugate (PCV13) vaccine. One dose is recommended after age 2. Pneumococcal polysaccharide (PPSV23) vaccine. One dose is recommended after age 105. Talk to your health care provider about which screenings and vaccines you need and how often you  need them. This information is not intended to replace  advice given to you by your health care provider. Make sure you discuss any questions you have with your health care provider. Document Released: 12/18/2015 Document Revised: 08/10/2016 Document Reviewed: 09/22/2015 Elsevier Interactive Patient Education  2017 [place] [employer] Prevention in the Home Falls can cause injuries. They can happen to people of all ages. There are many things you can do to make your home safe and to help prevent falls. What can I do on the outside of my home? Regularly fix the edges of walkways and driveways and fix any cracks. Remove anything that might make you trip as you walk through a door, such as a raised step or threshold. Trim any bushes or trees on the path to your home. Use bright outdoor lighting. Clear any walking paths of anything that might make someone trip, such as rocks or tools. Regularly check to see if handrails are loose or broken. Make sure that both sides of any steps have handrails. Any raised decks and porches should have guardrails on the edges. Have any leaves, snow, or ice cleared regularly. Use sand or salt on walking paths during winter. Clean up any spills in your garage right away. This includes oil or grease spills. What can I do in the bathroom? Use night lights. Install grab bars by the toilet and in the tub and shower. Do not use towel bars as grab bars. Use non-skid mats or decals in the tub or shower. If you need to sit down in the shower, use a plastic, non-slip stool. Keep the floor dry. Clean up any water that spills on the floor as soon as it happens. Remove soap buildup in the tub or shower regularly. Attach bath mats securely with double-sided non-slip rug tape. Do not have throw rugs and other things on the floor that can make you trip. What can I do in the bedroom? Use night lights. Make sure that you have a light by your bed that is easy to reach. Do not use any sheets or blankets that are too big for your bed.  They should not hang down onto the floor. Have a firm chair that has side arms. You can use this for support while you get dressed. Do not have throw rugs and other things on the floor that can make you trip. What can I do in the kitchen? Clean up any spills right away. Avoid walking on wet floors. Keep items that you use a lot in easy-to-reach places. If you need to reach something above you, use a strong step stool that has a grab bar. Keep electrical cords out of the way. Do not use floor polish or wax that makes floors slippery. If you must use wax, use non-skid floor wax. Do not have throw rugs and other things on the floor that can make you trip. What can I do with my stairs? Do not leave any items on the stairs. Make sure that there are handrails on both sides of the stairs and use them. Fix handrails that are broken or loose. Make sure that handrails are as long as the stairways. Check any carpeting to make sure that it is firmly attached to the stairs. Fix any carpet that is loose or worn. Avoid having throw rugs at the top or bottom of the stairs. If you do have throw rugs, attach them to the floor with carpet tape. Make sure that you have a light switch  at the top of the stairs and the bottom of the stairs. If you do not have them, ask someone to add them for you. What else can I do to help prevent falls? Wear shoes that: Do not have high heels. Have rubber bottoms. Are comfortable and fit you well. Are closed at the toe. Do not wear sandals. If you use a stepladder: Make sure that it is fully opened. Do not climb a closed stepladder. Make sure that both sides of the stepladder are locked into place. Ask someone to hold it for you, if possible. Clearly mark and make sure that you can see: Any grab bars or handrails. First and last steps. Where the edge of each step is. Use tools that help you move around (mobility aids) if they are needed. These  include: Canes. Walkers. Scooters. Crutches. Turn on the lights when you go into a dark area. Replace any light bulbs as soon as they burn out. Set up your furniture so you have a clear path. Avoid moving your furniture around. If any of your floors are uneven, fix them. If there are any pets around you, be aware of where they are. Review your medicines with your doctor. Some medicines can make you feel dizzy. This can increase your chance of falling. Ask your doctor what other things that you can do to help prevent falls. This information is not intended to replace advice given to you by your health care provider. Make sure you discuss any questions you have with your health care provider. Document Released: 09/17/2009 Document Revised: 04/28/2016 Document Reviewed: 12/26/2014 Elsevier Interactive Patient Education  2017 [employer].

## 2022-07-05 DIAGNOSIS — H04123 Dry eye syndrome of bilateral lacrimal glands: Secondary | ICD-10-CM | POA: Diagnosis not present

## 2022-12-23 DIAGNOSIS — H04123 Dry eye syndrome of bilateral lacrimal glands: Secondary | ICD-10-CM | POA: Diagnosis not present
